# Patient Record
Sex: Female | Born: 1935 | Race: White | Hispanic: No | Smoking: Never smoker
Health system: Southern US, Community
[De-identification: ages and names within clinical notes are randomized; demographics above are authoritative.]

## PROBLEM LIST (undated history)

## (undated) DIAGNOSIS — F0390 Unspecified dementia without behavioral disturbance: Secondary | ICD-10-CM

## (undated) DIAGNOSIS — I10 Essential (primary) hypertension: Secondary | ICD-10-CM

## (undated) HISTORY — PX: KNEE SURGERY: SHX244

## (undated) HISTORY — PX: ABDOMINAL HYSTERECTOMY: SHX81

---

## 1999-12-24 ENCOUNTER — Encounter: Payer: Self-pay | Admitting: Family Medicine

## 1999-12-24 ENCOUNTER — Encounter: Admission: RE | Admit: 1999-12-24 | Discharge: 1999-12-24 | Payer: Self-pay | Admitting: Family Medicine

## 2000-02-17 ENCOUNTER — Encounter: Admission: RE | Admit: 2000-02-17 | Discharge: 2000-05-17 | Payer: Self-pay | Admitting: Family Medicine

## 2002-04-12 ENCOUNTER — Encounter: Payer: Self-pay | Admitting: Family Medicine

## 2002-04-12 ENCOUNTER — Encounter: Admission: RE | Admit: 2002-04-12 | Discharge: 2002-04-12 | Payer: Self-pay | Admitting: Family Medicine

## 2002-05-15 ENCOUNTER — Encounter: Admission: RE | Admit: 2002-05-15 | Discharge: 2002-08-13 | Payer: Self-pay | Admitting: Family Medicine

## 2002-12-26 ENCOUNTER — Ambulatory Visit (HOSPITAL_BASED_OUTPATIENT_CLINIC_OR_DEPARTMENT_OTHER): Admission: RE | Admit: 2002-12-26 | Discharge: 2002-12-26 | Payer: Self-pay | Admitting: Orthopedic Surgery

## 2003-04-30 ENCOUNTER — Encounter: Admission: RE | Admit: 2003-04-30 | Discharge: 2003-04-30 | Payer: Self-pay | Admitting: Family Medicine

## 2003-04-30 ENCOUNTER — Encounter: Payer: Self-pay | Admitting: Family Medicine

## 2003-05-02 ENCOUNTER — Encounter: Admission: RE | Admit: 2003-05-02 | Discharge: 2003-07-31 | Payer: Self-pay | Admitting: Family Medicine

## 2003-05-22 ENCOUNTER — Ambulatory Visit (HOSPITAL_BASED_OUTPATIENT_CLINIC_OR_DEPARTMENT_OTHER): Admission: RE | Admit: 2003-05-22 | Discharge: 2003-05-23 | Payer: Self-pay | Admitting: Orthopedic Surgery

## 2003-12-11 ENCOUNTER — Ambulatory Visit: Admission: RE | Admit: 2003-12-11 | Discharge: 2003-12-11 | Payer: Self-pay | Admitting: Family Medicine

## 2004-05-04 ENCOUNTER — Encounter: Admission: RE | Admit: 2004-05-04 | Discharge: 2004-05-04 | Payer: Self-pay | Admitting: Family Medicine

## 2004-05-18 ENCOUNTER — Inpatient Hospital Stay (HOSPITAL_COMMUNITY): Admission: RE | Admit: 2004-05-18 | Discharge: 2004-05-21 | Payer: Self-pay | Admitting: Orthopedic Surgery

## 2005-05-17 ENCOUNTER — Inpatient Hospital Stay (HOSPITAL_COMMUNITY): Admission: RE | Admit: 2005-05-17 | Discharge: 2005-05-20 | Payer: Self-pay | Admitting: Orthopedic Surgery

## 2005-09-17 ENCOUNTER — Encounter: Admission: RE | Admit: 2005-09-17 | Discharge: 2005-09-17 | Payer: Self-pay | Admitting: Family Medicine

## 2006-09-01 ENCOUNTER — Encounter: Admission: RE | Admit: 2006-09-01 | Discharge: 2006-11-30 | Payer: Self-pay | Admitting: Family Medicine

## 2006-10-11 ENCOUNTER — Encounter: Admission: RE | Admit: 2006-10-11 | Discharge: 2006-10-11 | Payer: Self-pay | Admitting: Family Medicine

## 2006-12-27 ENCOUNTER — Encounter: Admission: RE | Admit: 2006-12-27 | Discharge: 2006-12-27 | Payer: Self-pay | Admitting: Orthopedic Surgery

## 2007-01-11 ENCOUNTER — Encounter: Admission: RE | Admit: 2007-01-11 | Discharge: 2007-01-11 | Payer: Self-pay | Admitting: Orthopedic Surgery

## 2007-02-16 ENCOUNTER — Encounter: Admission: RE | Admit: 2007-02-16 | Discharge: 2007-02-16 | Payer: Self-pay | Admitting: Orthopedic Surgery

## 2007-03-18 ENCOUNTER — Emergency Department (HOSPITAL_COMMUNITY): Admission: EM | Admit: 2007-03-18 | Discharge: 2007-03-18 | Payer: Self-pay | Admitting: Family Medicine

## 2007-06-21 ENCOUNTER — Encounter: Payer: Self-pay | Admitting: Pulmonary Disease

## 2007-09-01 ENCOUNTER — Ambulatory Visit: Payer: Self-pay | Admitting: Gastroenterology

## 2007-09-11 ENCOUNTER — Ambulatory Visit: Payer: Self-pay | Admitting: Gastroenterology

## 2007-12-18 ENCOUNTER — Encounter: Admission: RE | Admit: 2007-12-18 | Discharge: 2007-12-18 | Payer: Self-pay | Admitting: Family Medicine

## 2008-12-13 ENCOUNTER — Encounter: Admission: RE | Admit: 2008-12-13 | Discharge: 2008-12-13 | Payer: Self-pay | Admitting: Family Medicine

## 2009-10-15 DIAGNOSIS — I10 Essential (primary) hypertension: Secondary | ICD-10-CM | POA: Insufficient documentation

## 2009-10-15 DIAGNOSIS — F329 Major depressive disorder, single episode, unspecified: Secondary | ICD-10-CM

## 2009-10-17 ENCOUNTER — Ambulatory Visit: Payer: Self-pay | Admitting: Pulmonary Disease

## 2009-10-17 DIAGNOSIS — E785 Hyperlipidemia, unspecified: Secondary | ICD-10-CM

## 2009-10-17 DIAGNOSIS — G4730 Sleep apnea, unspecified: Secondary | ICD-10-CM | POA: Insufficient documentation

## 2009-12-01 ENCOUNTER — Ambulatory Visit: Payer: Self-pay | Admitting: Pulmonary Disease

## 2009-12-30 ENCOUNTER — Encounter: Payer: Self-pay | Admitting: Pulmonary Disease

## 2009-12-31 ENCOUNTER — Ambulatory Visit: Payer: Self-pay | Admitting: Pulmonary Disease

## 2010-01-12 ENCOUNTER — Encounter: Payer: Self-pay | Admitting: Pulmonary Disease

## 2010-01-14 ENCOUNTER — Encounter: Payer: Self-pay | Admitting: Pulmonary Disease

## 2010-02-04 ENCOUNTER — Encounter: Payer: Self-pay | Admitting: Pulmonary Disease

## 2010-10-21 ENCOUNTER — Ambulatory Visit: Payer: Self-pay | Admitting: Cardiology

## 2010-10-21 ENCOUNTER — Inpatient Hospital Stay (HOSPITAL_COMMUNITY): Admission: AD | Admit: 2010-10-21 | Discharge: 2010-10-24 | Payer: Self-pay | Admitting: Internal Medicine

## 2010-10-22 ENCOUNTER — Encounter (INDEPENDENT_AMBULATORY_CARE_PROVIDER_SITE_OTHER): Payer: Self-pay | Admitting: Internal Medicine

## 2010-10-23 ENCOUNTER — Encounter (INDEPENDENT_AMBULATORY_CARE_PROVIDER_SITE_OTHER): Payer: Self-pay | Admitting: Internal Medicine

## 2010-10-30 ENCOUNTER — Encounter: Payer: Self-pay | Admitting: Pulmonary Disease

## 2010-11-09 ENCOUNTER — Ambulatory Visit: Payer: Self-pay | Admitting: Pulmonary Disease

## 2010-11-09 DIAGNOSIS — J90 Pleural effusion, not elsewhere classified: Secondary | ICD-10-CM | POA: Insufficient documentation

## 2010-11-09 DIAGNOSIS — R638 Other symptoms and signs concerning food and fluid intake: Secondary | ICD-10-CM | POA: Insufficient documentation

## 2010-11-11 ENCOUNTER — Telehealth: Payer: Self-pay | Admitting: Pulmonary Disease

## 2010-11-17 ENCOUNTER — Ambulatory Visit: Payer: Self-pay | Admitting: Internal Medicine

## 2010-11-17 ENCOUNTER — Telehealth: Payer: Self-pay | Admitting: Pulmonary Disease

## 2010-11-25 ENCOUNTER — Ambulatory Visit
Admission: RE | Admit: 2010-11-25 | Discharge: 2010-11-25 | Payer: Self-pay | Source: Home / Self Care | Attending: Pulmonary Disease | Admitting: Pulmonary Disease

## 2010-12-13 ENCOUNTER — Encounter: Payer: Self-pay | Admitting: Family Medicine

## 2010-12-18 ENCOUNTER — Other Ambulatory Visit (HOSPITAL_COMMUNITY): Payer: Self-pay | Admitting: Internal Medicine

## 2010-12-18 DIAGNOSIS — Z0000 Encounter for general adult medical examination without abnormal findings: Secondary | ICD-10-CM

## 2010-12-22 NOTE — Assessment & Plan Note (Signed)
Summary: 1 month/ mbw   Copy to:  pcp Primary Provider/Referring Provider:  Dr. Felipa Eth  CC:  1 month followup.  Pt states that her cpap mask is uncomfortable and she has difficulty with keeping it on.  She states that she has not used cpap in over 1 wk.  .  History of Present Illness: 77/F referred for post CPAP FU of obstructive sleep apnea. She underwent PSG in 04/21/2007 at a sleep lab on [facility] & required CPAP intervention. She never used her CPAP consistently , ' it would always be off my face in the mornings' & then stopped altogether. She now reports excessive daytime fatigue . Epworth Sleepiness Score is 5/24 but I suspect under-reporting. Sleep latency is minimal, her husband of 53 years passed away in 04-21-07, & she has lost 20 lbs since. Started on Pristiq foe depression.  She haas 2-3 awakenings for nocturia, daughter describes loud snoring which has improved with wt loss but no witness for apneas. Sleep is non refreshing, no morning headaches. Other causes of fatigue include depressions, meds, occult hypothyroidism, uncontrolled DM 12/1 > reviewed PSG 06/21/07, wt 231 lbs, RDI 35/h, nadir desatn 86%, correctd by CPAP 9 cm, nasal mask., rare PLMs.  December 01, 2009 3:19 PM  Used CPAP x 3 hrs/ night then would wake up with CPAP off her face. Could not put it back on due to many straps & lights off. C/o memory deficits. Pressure Ok, mask difficult to use, no dryness. Trial of nasal pillows - this may make it simpler for her to use & put back on if it comes off at night.  December 31, 2009 3:58 PM  Got new swift pillows Reviewed download 1/12-2/8 >> poor compliance, leak ++, AHI ok Appears frustrated & confused as to reasons for non compliance - unable to pinpoint  a particular issue. At the same time ' worried about having a heart attack inmy sleep'    Current Medications (verified): 1)  Januvia 100 Mg Tabs (Sitagliptin Phosphate) .Marland Kitchen.. 1 Once Daily 2)  Avandamet 02-999 Mg Tabs  (Rosiglitazone-Metformin) .Marland Kitchen.. 1 Two Times A Day 3)  Glipizide 5 Mg Tabs (Glipizide) .Marland Kitchen.. 1 Once Daily 4)  Triamterene-Hctz 37.5-25 Mg Tabs (Triamterene-Hctz) .Marland Kitchen.. 1 Once Daily 5)  Benicar 40 Mg Tabs (Olmesartan Medoxomil) .... 1/2 Once Daily 6)  Aspirin 81 Mg Tbec (Aspirin) .Marland Kitchen.. 1 Once Daily 7)  Miralax  Pack (Polyethylene Glycol 3350) .... As Directed As Needed 8)  Meclizine Hcl 12.5 Mg Tabs (Meclizine Hcl) .Marland Kitchen.. 1-2 Tabs 3-4 Times Per Day As Needed  Allergies (verified): No Known Drug Allergies  Past History:  Past Medical History: Last updated: 10/17/2009 HYPERLIPIDEMIA (ICD-272.4) DEPRESSION (ICD-311) HYPERTENSION (ICD-401.9)    Social History: Last updated: 10/17/2009 Marital Status: widowed Children: yes Occupation: retired Patient never smoked.   Review of Systems  The patient denies anorexia, fever, weight loss, weight gain, vision loss, decreased hearing, hoarseness, chest pain, syncope, dyspnea on exertion, peripheral edema, prolonged cough, headaches, hemoptysis, abdominal pain, melena, hematochezia, severe indigestion/heartburn, hematuria, muscle weakness, difficulty walking, depression, unusual weight change, and abnormal bleeding.    Vital Signs:  Patient profile:   77 year old female Weight:      208 pounds O2 Sat:      99 % on Room air Temp:     97.9 degrees F oral Pulse rate:   82 / minute BP sitting:   138 / 76  (left arm)  Vitals Entered By: Vernie Murders (December 31, 2009 3:53 PM)  O2 Flow:  Room air  Physical Exam  Additional Exam:  Gen. Pleasant, well-nourished, in no distress, normal affect ENT - no lesions, no post nasal drip, class 2 airway Neck: No JVD, no thyromegaly, no carotid bruits Lungs: no use of accessory muscles, no dullness to percussion, clear without rales or rhonchi  Cardiovascular: Rhythm regular, heart sounds  normal, no murmurs or gallops, no peripheral edema Musculoskeletal: No deformities, no cyanosis or clubbing       Impression & Recommendations:  Problem # 1:  OBSTRUCTIVE SLEEP APNEA (ICD-780.57)  Compliance encouraged, wt loss emphasized, asked to avoid meds with sedative side effects, cautioned against driving when sleepy.  She will go over to the sleeplab for mask fit & desensitization. Leak will also be investigated. If compliance remians poor, it may be time to abandon cpap approach & investigae other causes of fatigue- hypothyroidism, depression etc.  Orders: Est. Patient Level III (21308) DME Referral (DME)  Medications Added to Medication List This Visit: 1)  Meclizine Hcl 12.5 Mg Tabs (Meclizine hcl) .Marland Kitchen.. 1-2 tabs 3-4 times per day as needed  Patient Instructions: 1)  Copy sent to: Dr Felipa Eth 2)  Call Sleep Lab [phone] & schedule appointment for mask/ machine check. 3)  We will check another download in 1 month. 4)  If no better, we will ask Dr Felipa Eth to look at other causes of fatigue.

## 2010-12-22 NOTE — Letter (Signed)
Summary: [facility]  [facility]   Imported By: Sherian Rein 01/21/2010 11:45:52  _____________________________________________________________________  External Attachment:    Type:   Image     Comment:   External Document

## 2010-12-22 NOTE — Assessment & Plan Note (Signed)
Summary: rov ///kp   Visit Type:  Follow-up Copy to:  pcp Primary Provider/Referring Provider:  Dr. Felipa Eth  CC:  Pt here for follow up.  History of Present Illness: 77/F referred for post CPAP FU of obstructive sleep apnea. She underwent PSG in 04-27-2007 at a sleep lab on [facility] & required CPAP intervention. She never used her CPAP consistently , ' it would always be off my face in the mornings' & then stopped altogether. She now reports excessive daytime fatigue . Epworth Sleepiness Score is 5/24 but I suspect under-reporting. Sleep latency is minimal, her husband of 53 years passed away in 2007/04/27, & she has lost 20 lbs since. Started on Pristiq foe depression.  She haas 2-3 awakenings for nocturia, daughter describes loud snoring which has improved with wt loss but no witness for apneas. Sleep is non refreshing, no morning headaches. Other causes of fatigue include depressions, meds, occult hypothyroidism, uncontrolled DM 12/1 > reviewed PSG 06/21/07, wt 231 lbs, RDI 35/h, nadir desatn 86%, correctd by CPAP 9 cm, nasal mask., rare PLMs.  December 01, 2009 3:19 PM  Used CPAP x 3 hrs/ night then would wake up with CPAP off her face. Could not put it back on due to many straps & lights off. C/o memory deficits. Pressure Ok, mask difficult to use, no dryness.  Current Medications (verified): 1)  Januvia 100 Mg Tabs (Sitagliptin Phosphate) .Marland Kitchen.. 1 Once Daily 2)  Avandamet 02-999 Mg Tabs (Rosiglitazone-Metformin) .Marland Kitchen.. 1 Two Times A Day 3)  Glipizide 5 Mg Tabs (Glipizide) .Marland Kitchen.. 1 Once Daily 4)  Triamterene-Hctz 37.5-25 Mg Tabs (Triamterene-Hctz) .Marland Kitchen.. 1 Once Daily 5)  Benicar 40 Mg Tabs (Olmesartan Medoxomil) .... 1/2 Once Daily 6)  Aspirin 81 Mg Tbec (Aspirin) .Marland Kitchen.. 1 Once Daily 7)  Miralax  Powd (Polyethylene Glycol 3350) .... As Needed 8)  Miralax  Pack (Polyethylene Glycol 3350) .... As Directed As Needed  Allergies (verified): No Known Drug Allergies  Past History:  Past Medical History: Last  updated: 10/17/2009 HYPERLIPIDEMIA (ICD-272.4) DEPRESSION (ICD-311) HYPERTENSION (ICD-401.9)    Social History: Last updated: 10/17/2009 Marital Status: widowed Children: yes Occupation: retired Patient never smoked.   Review of Systems  The patient denies anorexia, fever, weight loss, weight gain, vision loss, decreased hearing, hoarseness, chest pain, syncope, dyspnea on exertion, peripheral edema, prolonged cough, headaches, hemoptysis, abdominal pain, melena, hematochezia, severe indigestion/heartburn, hematuria, muscle weakness, difficulty walking, depression, unusual weight change, and abnormal bleeding.    Vital Signs:  Patient profile:   77 year old female Height:      65 inches Weight:      213 pounds O2 Sat:      97 % on Room air Temp:     97.9 degrees F oral Pulse rate:   74 / minute BP sitting:   138 / 70  (left arm) Cuff size:   large  Vitals Entered By: Zackery Barefoot CMA (December 01, 2009 2:57 PM)  O2 Flow:  Room air CC: Pt here for follow up Comments Medications reviewed with patient Zackery Barefoot CMA  December 01, 2009 2:57 PM    Physical Exam  Additional Exam:  Gen. Pleasant, well-nourished, in no distress, normal affect ENT - no lesions, no post nasal drip, class 2 airway Neck: No JVD, no thyromegaly, no carotid bruits Lungs: no use of accessory muscles, no dullness to percussion, clear without rales or rhonchi  Cardiovascular: Rhythm regular, heart sounds  normal, no murmurs or gallops, no peripheral edema Musculoskeletal: No deformities, no  cyanosis or clubbing      Impression & Recommendations:  Problem # 1:  OBSTRUCTIVE SLEEP APNEA (ICD-780.57) Compliance encouraged, wt loss emphasized, asked to avoid meds with sedative side effects, cautioned against driving when sleepy.  Trial of nasal pillows - this may make it simpler for her to use & put back on if it comes off at night. Review download for objective data. If remains sleepy,  consider other causes Orders: Est. Patient Level III (22025) DME Referral (DME)  Medications Added to Medication List This Visit: 1)  Miralax Pack (Polyethylene glycol 3350) .... As directed as needed  Patient Instructions: 1)  Please schedule a follow-up appointment in 1 month. 2)  Trial of nasal pillows & download

## 2010-12-24 ENCOUNTER — Ambulatory Visit (HOSPITAL_COMMUNITY)
Admission: RE | Admit: 2010-12-24 | Discharge: 2010-12-24 | Disposition: A | Discharge status: Home / Self Care | Payer: Medicare Other | Source: Ambulatory Visit | Attending: Internal Medicine | Admitting: Internal Medicine

## 2010-12-24 DIAGNOSIS — Z0000 Encounter for general adult medical examination without abnormal findings: Secondary | ICD-10-CM

## 2010-12-24 DIAGNOSIS — Z1231 Encounter for screening mammogram for malignant neoplasm of breast: Secondary | ICD-10-CM | POA: Insufficient documentation

## 2010-12-24 NOTE — Assessment & Plan Note (Signed)
Summary: rov 2-3 wks w/ cxr- ok to double per RA//jwr   Visit Type:  Follow-up Copy to:  pcp Primary Provider/Referring Provider:  Dr. Felipa Eth  CC:  Pt states breathing has improved. c/o constipation x 2 to 3 weeks.  History of Present Illness: 77/F for FU of obstructive sleep apnea & rt pleural effusion.  Initial consult for fatigue attributed to sleep apnea. Other possible causes of fatigue include depression, meds, occult hypothyroidism, uncontrolled DM 12/1 > reviewed PSG 06/21/07, wt 231 lbs, RDI 35/h, nadir desatn 86%, corrected by CPAP 9 cm, nasal mask., rare PLMs. Could not tolerate CPAP   November 09, 2010  Adm 11/30 -10/24/10 for rt effusion, thoracentesis - transudative fluid wih prot < 3, LDH 92, 500 WCs , 59 % lymphs, CT chest showed large rt & small left effusion, echo - RVSP 49, BNP 297 12/9 serum alb 3.1, Urine prot neg She has lost 40 lbs over the last yr, 64 lbs since 2008 ! No pedal edema, orthopnea, PND - feels better with lasix, good urine output  November 25, 2010 1:55 PM  Dyspnea improved, no cough, lost another 6 lbs, CT abd neg, c/o constipation CXR  - decreased effusion    Preventive Screening-Counseling & Management  Alcohol-Tobacco     Smoking Status: never  Current Medications (verified): 1)  Januvia 100 Mg Tabs (Sitagliptin Phosphate) .Marland Kitchen.. 1 Once Daily 2)  Metformin Hcl 1000 Mg Tabs (Metformin Hcl) .... Take 1 Tablet By Mouth Two Times A Day 3)  Miralax  Pack (Polyethylene Glycol 3350) .... As Directed As Needed 4)  Simvastatin 40 Mg Tabs (Simvastatin) .... Take 1 Tablet By Mouth Once A Day 5)  Aricept 10 Mg Tabs (Donepezil Hcl) .... Take 1 Tablet By Mouth Once A Day 6)  Aspirin 81 Mg  Tabs (Aspirin) .... Take 1 Tablet By Mouth Once A Day 7)  Losartan Potassium 50 Mg Tabs (Losartan Potassium) .... Take 1/2 Tablet By Mouth Two Times A Day 8)  Vitamin D (Ergocalciferol) 50000 Unit Caps (Ergocalciferol) .... Twice Weekly 9)  Citalopram Hydrobromide 20  Mg Tabs (Citalopram Hydrobromide) .... Take 1 Tablet By Mouth Once A Day 10)  Furosemide 40 Mg Tabs (Furosemide) .... Take 1 Tablet By Mouth Every Morning 11)  Klor-Con M20 20 Meq Cr-Tabs (Potassium Chloride Crys Cr) .... Take 1 Tablet By Mouth Every Morning  Allergies (verified): No Known Drug Allergies  Past History:  Past Medical History: Last updated: 10/17/2009 HYPERLIPIDEMIA (ICD-272.4) DEPRESSION (ICD-311) HYPERTENSION (ICD-401.9)    Social History: Last updated: 10/17/2009 Marital Status: widowed Children: yes Occupation: retired Patient never smoked.   Review of Systems  The patient denies anorexia, fever, weight loss, weight gain, vision loss, decreased hearing, hoarseness, chest pain, syncope, dyspnea on exertion, peripheral edema, prolonged cough, headaches, hemoptysis, abdominal pain, melena, hematochezia, severe indigestion/heartburn, hematuria, muscle weakness, suspicious skin lesions, transient blindness, difficulty walking, depression, unusual weight change, abnormal bleeding, enlarged lymph nodes, and angioedema.    Vital Signs:  Patient profile:   77 year old female Height:      65 inches Weight:      160 pounds BMI:     26.72 O2 Sat:      98 % on Room air Temp:     98.4 degrees F oral Pulse rate:   66 / minute BP sitting:   116 / 58  (right arm) Cuff size:   regular  Vitals Entered By: Zackery Barefoot CMA (November 25, 2010 1:42 PM)  O2  Flow:  Room air CC: Pt states breathing has improved. c/o constipation x 2 to 3 weeks Comments Medications reviewed with patient Verified contact number and pharmacy with patient Zackery Barefoot CMA  November 25, 2010 1:42 PM    Physical Exam  Additional Exam:  Gen. Pleasant, well-nourished, in no distress, normal affect ENT - no lesions, no post nasal drip, class 2 airway Neck: No JVD, no thyromegaly, no carotid bruits Lungs: no use of accessory muscles, no dullness to percussion, decreased rt base, no rhonchi    Cardiovascular: Rhythm regular, heart sounds  normal, no murmurs or gallops, no peripheral edema Musculoskeletal: No deformities, no cyanosis or clubbing      CXR  Procedure date:  11/25/2010  Findings:      Comparison: 11/09/2010   Findings: Pleural effusion on the right is smaller.  There is some persistent volume loss at the right base related to the fusion. The upper lung is clear.  On the left, there is no pleural fluid. The left lung is clear.  Ordinary degenerative changes effect the spine.   IMPRESSION: Reduction in amount of pleural fluid on the right.  Some persistent fluid at the base with adjacent atelectatic change in the right lower lung.  CT Abdomen/Pelvis  Procedure date:  11/17/2010  Findings:      IMPRESSION: Moderate right pleural effusion.   No findings to explain the patient's history of unintentional weight loss.  Impression & Recommendations:  Problem # 1:  PLEURAL EFFUSION (ICD-511.9) Assessment Improved Almost resolved with diuresis Decrease lasix to 20mg  (1/2 tab) x 2 weeks then stop & observe for recurrence Orders: Est. Patient Level III (36644) T-2 View CXR (71020TC)  Problem # 2:  WEIGHT LOSS-ABNORMAL (ICD-783.2) Unclear cause Ct abd neg, previous wu by dr Felipa Eth noted ? depression Age appropriate cancer screening- defer to dr Felipa Eth  Patient Instructions: 1)  Copy sent to: Dr Felipa Eth 2)  Please schedule a follow-up appointment in 4 months. 3)  The fluid has resolved.  4)  Decrease lasix to 1/2 pill once daily

## 2010-12-24 NOTE — Letter (Signed)
Summary: [facility]  [facility]   Imported By: Lester [place] 11/07/2010 11:03:04  _____________________________________________________________________  External Attachment:    Type:   Image     Comment:   External Document

## 2010-12-24 NOTE — Progress Notes (Signed)
Summary: rov  Phone Note Call from Patient   Caller: Daughter Gwenn Call For: alva Summary of Call: pt's daughter was told to call re: an appt for pt that was resc'd. 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 call and apeak to gwenn at [phone] Initial call taken by: Tivis Ringer, CNA,  November 11, 2010 4:21 PM  Follow-up for Phone Call        Clarified with [facility] date and time of appointment. Zackery Barefoot CMA  November 11, 2010 4:38 PM

## 2010-12-24 NOTE — Assessment & Plan Note (Signed)
Summary: ROV W/ CXR ///KP   Visit Type:  Follow-up Copy to:  pcp Primary Provider/Referring Provider:  Dr. Felipa Eth  CC:  Pt c/o SOB and intermittent dry cough.  History of Present Illness: 78/F for FU of obstructive sleep apnea & rt pleural effusion.  Initial consult for fatigue attributed to sleep apnea. Other possible causes of fatigue include depression, meds, occult hypothyroidism, uncontrolled DM 12/1 > reviewed PSG 06/21/07, wt 231 lbs, RDI 35/h, nadir desatn 86%, corrected by CPAP 9 cm, nasal mask., rare PLMs. Could not tolerate CPAP   November 09, 2010  Adm 11/30 -10/24/10 for rt effusion, thoracentesis - transudative fluid wih prot < 3, LDH 92, 500 WCs , 59 % lymphs, CT chest showed large rt & small left effusion, echo - RVSP 49, BNP 297 12/9 serum alb 3.1, Urine prot neg She has lost 40 lbs over the last yr, 64 lbs since 2008 ! No pedal edema, orthopnea, PND - feels better with lasix, good urine output    Preventive Screening-Counseling & Management  Alcohol-Tobacco     Smoking Status: never  Current Medications (verified): 1)  Januvia 100 Mg Tabs (Sitagliptin Phosphate) .Marland Kitchen.. 1 Once Daily 2)  Metformin Hcl 1000 Mg Tabs (Metformin Hcl) .... Take 1 Tablet By Mouth Two Times A Day 3)  Miralax  Pack (Polyethylene Glycol 3350) .... As Directed As Needed 4)  Simvastatin 40 Mg Tabs (Simvastatin) .... Take 1 Tablet By Mouth Once A Day 5)  Aricept 10 Mg Tabs (Donepezil Hcl) .... Take 1 Tablet By Mouth Once A Day 6)  Aspirin 81 Mg  Tabs (Aspirin) .... Take 1 Tablet By Mouth Once A Day 7)  Losartan Potassium 50 Mg Tabs (Losartan Potassium) .... Take 1/2 Tablet By Mouth Two Times A Day 8)  Vitamin D (Ergocalciferol) 50000 Unit Caps (Ergocalciferol) .... Twice Weekly 9)  Citalopram Hydrobromide 20 Mg Tabs (Citalopram Hydrobromide) .... Take 1 Tablet By Mouth Once A Day 10)  Furosemide 40 Mg Tabs (Furosemide) .... Take 1 Tablet By Mouth Every Morning 11)  Klor-Con M20 20 Meq Cr-Tabs  (Potassium Chloride Crys Cr) .... Take 1 Tablet By Mouth Every Morning  Allergies (verified): No Known Drug Allergies  Past History:  Past Medical History: Last updated: 10/17/2009 HYPERLIPIDEMIA (ICD-272.4) DEPRESSION (ICD-311) HYPERTENSION (ICD-401.9)    Social History: Last updated: 10/17/2009 Marital Status: widowed Children: yes Occupation: retired Patient never smoked.   Review of Systems       The patient complains of weight loss.  The patient denies anorexia, fever, weight gain, vision loss, decreased hearing, hoarseness, chest pain, syncope, dyspnea on exertion, peripheral edema, prolonged cough, headaches, hemoptysis, abdominal pain, melena, hematochezia, severe indigestion/heartburn, hematuria, muscle weakness, suspicious skin lesions, difficulty walking, depression, unusual weight change, abnormal bleeding, enlarged lymph nodes, and angioedema.    Vital Signs:  Patient profile:   77 year old female Height:      65 inches Weight:      166 pounds BMI:     27.72 O2 Sat:      96 % on Room air Temp:     98.1 degrees F oral Pulse rate:   67 / minute BP sitting:   118 / 62  (left arm) Cuff size:   regular  Vitals Entered By: Zackery Barefoot CMA (November 09, 2010 2:28 PM)  O2 Flow:  Room air CC: Pt c/o SOB, intermittent dry cough Comments Medications reviewed with patient Verified contact number and pharmacy with patient Zackery Barefoot [facility]  November 09, 2010  2:37 PM    Physical Exam  Additional Exam:  Gen. Pleasant, well-nourished, in no distress, normal affect ENT - no lesions, no post nasal drip, class 2 airway Neck: No JVD, no thyromegaly, no carotid bruits Lungs: no use of accessory muscles, no dullness to percussion, decreased rt base, no rhonchi  Cardiovascular: Rhythm regular, heart sounds  normal, no murmurs or gallops, no peripheral edema Musculoskeletal: No deformities, no cyanosis or clubbing      CXR  Procedure date:   11/09/2010  Findings:      Comparison: Chest x-ray of 10/23/2010   Findings: There has been slight increase in volume of the right pleural effusion with right basilar atelectasis.  The left effusion is no longer seen.  No focal infiltrate is noted.  The heart is within normal limits in size.  No bony abnormality is seen.   IMPRESSION: Slight increase in volume of right pleural effusion with mild right basilar atelectasis.  Impression & Recommendations:  Problem # 1:  PLEURAL EFFUSION (ICD-511.9) Unclear cause - no hepatic or renal pathology obvious Only discernible cause is pulm hypertension - stay on lasix , no diastolic dysfunction noted on echo but doubt this is primary at her age. ct lasix x 2 more weeks & see if effusion decreases by rpt CXR Orders: Est. Patient Level IV (16967) Radiology Referral (Radiology) T-2 View CXR (71020TC)  Problem # 2:  WEIGHT LOSS-ABNORMAL (ICD-783.2) Occult malignancy remains a concern & age appropriate cancer screening would be recommended CT abdomen/ pelvis  Orders: Est. Patient Level IV (89381) Radiology Referral (Radiology)  Medications Added to Medication List This Visit: 1)  Metformin Hcl 1000 Mg Tabs (Metformin hcl) .... Take 1 tablet by mouth two times a day 2)  Simvastatin 40 Mg Tabs (Simvastatin) .... Take 1 tablet by mouth once a day 3)  Aricept 10 Mg Tabs (Donepezil hcl) .... Take 1 tablet by mouth once a day 4)  Aspirin 81 Mg Tabs (Aspirin) .... Take 1 tablet by mouth once a day 5)  Losartan Potassium 50 Mg Tabs (Losartan potassium) .... Take 1/2 tablet by mouth once a day 6)  Losartan Potassium 50 Mg Tabs (Losartan potassium) .... Take 1/2 tablet by mouth two times a day 7)  Vitamin D (ergocalciferol) 50000 Unit Caps (Ergocalciferol) .... Twice weekly 8)  Citalopram Hydrobromide 20 Mg Tabs (Citalopram hydrobromide) .... Take 1 tablet by mouth once a day 9)  Furosemide 40 Mg Tabs (Furosemide) .... Take 1 tablet by mouth every  morning 10)  Klor-con M20 20 Meq Cr-tabs (Potassium chloride crys cr) .... Take 1 tablet by mouth every morning  Patient Instructions: 1)  Copy sent to:dr Avva 2)  Please schedule a follow-up appointment in 2- 3 weeks with chest x ray 3)  Stay on water pills once daily  4)  CT abdomen with contrast

## 2010-12-24 NOTE — Progress Notes (Signed)
Summary: ? metformin -restart on 11-18-10  Phone Note Call from Patient Call back at [phone]   Caller: patient's daughter, Dedra Skeens Call For: Vassie Loll Summary of Call: Daughter calling stating pt had stopped her Metformin (per CT instructions) and she had CT done today that was ordered by RA.   Daughter wanted to know when pt can restart her Metformin.  Please advise.  Thanks.   Initial call taken by: Arman Filter LPN,  November 17, 2010 12:01 PM  Follow-up for Phone Call        ok to resume tomorrow. Also let her know - no findings on CT to explain her wt loss Follow-up by: Comer Locket. Vassie Loll MD,  November 17, 2010 3:03 PM  Additional Follow-up for Phone Call Additional follow up Details #1::        [facility] Gweneth Dimitri RN  November 17, 2010 3:21 PM     Additional Follow-up for Phone Call Additional follow up Details #2::    pt daughter aware of recs and CT results.Carron Curie CMA  November 17, 2010 3:31 PM

## 2011-02-02 LAB — COMPREHENSIVE METABOLIC PANEL
ALT: 16 U/L (ref 0–35)
ALT: 16 U/L (ref 0–35)
AST: 26 U/L (ref 0–37)
AST: 26 U/L (ref 0–37)
Albumin: 3.6 g/dL (ref 3.5–5.2)
CO2: 28 mEq/L (ref 19–32)
Calcium: 8.8 mg/dL (ref 8.4–10.5)
Chloride: 104 mEq/L (ref 96–112)
Creatinine, Ser: 1.01 mg/dL (ref 0.4–1.2)
GFR calc Af Amer: >60 mL/min (ref >60)
GFR calc non Af Amer: 56 mL/min — ABNORMAL LOW (ref >60)
Glucose, Bld: 86 mg/dL (ref 70–99)
Sodium: 141 mEq/L (ref 135–145)
Sodium: 143 mEq/L (ref 135–145)
Total Bilirubin: 0.5 mg/dL (ref 0.3–1.2)
Total Protein: 6.2 g/dL (ref 6.0–8.3)

## 2011-02-02 LAB — CBC
HCT: 27.7 % — ABNORMAL LOW (ref 36.0–46.0)
HCT: 31.4 % — ABNORMAL LOW (ref 36.0–46.0)
Hemoglobin: 10 g/dL — ABNORMAL LOW (ref 12.0–15.0)
Hemoglobin: 9 g/dL — ABNORMAL LOW (ref 12.0–15.0)
MCH: 30.8 pg (ref 26.0–34.0)
MCH: 31.4 pg (ref 26.0–34.0)
MCHC: 31.8 g/dL (ref 30.0–36.0)
MCHC: 32.5 g/dL (ref 30.0–36.0)
RBC: 2.87 MIL/uL — ABNORMAL LOW (ref 3.87–5.11)
RDW: 15.2 % (ref 11.5–15.5)

## 2011-02-02 LAB — URINALYSIS, ROUTINE W REFLEX MICROSCOPIC
Glucose, UA: NEGATIVE mg/dL
Ketones, ur: NEGATIVE mg/dL
Nitrite: NEGATIVE
Protein, ur: NEGATIVE mg/dL
Specific Gravity, Urine: 1.012 (ref 1.005–1.030)

## 2011-02-02 LAB — GLUCOSE, CAPILLARY
Glucose-Capillary: 106 mg/dL — ABNORMAL HIGH (ref 70–99)
Glucose-Capillary: 80 mg/dL (ref 70–99)
Glucose-Capillary: 89 mg/dL (ref 70–99)
Glucose-Capillary: 95 mg/dL (ref 70–99)
Glucose-Capillary: 97 mg/dL (ref 70–99)

## 2011-02-02 LAB — PROTEIN, BODY FLUID: Total protein, fluid: <3 g/dL

## 2011-02-02 LAB — DIFFERENTIAL
Basophils Absolute: 0 10*3/uL (ref 0.0–0.1)
Basophils Relative: 0 % (ref 0–1)
Eosinophils Absolute: 0.1 10*3/uL (ref 0.0–0.7)
Monocytes Absolute: 0.5 10*3/uL (ref 0.1–1.0)
Monocytes Relative: 9 % (ref 3–12)

## 2011-02-02 LAB — BODY FLUID CELL COUNT WITH DIFFERENTIAL

## 2011-02-02 LAB — BODY FLUID CULTURE: Culture: NO GROWTH

## 2011-02-02 LAB — URINE MICROSCOPIC-ADD ON

## 2011-02-02 LAB — PATHOLOGIST SMEAR REVIEW

## 2011-02-02 LAB — LACTATE DEHYDROGENASE, PLEURAL OR PERITONEAL FLUID

## 2011-02-02 LAB — AMYLASE, BODY FLUID: Amylase, Fluid: 38 U/L

## 2011-02-03 LAB — BASIC METABOLIC PANEL
BUN: 18 mg/dL (ref 6–23)
CO2: 27 mEq/L (ref 19–32)
Calcium: 8.6 mg/dL (ref 8.4–10.5)
Chloride: 103 mEq/L (ref 96–112)
Creatinine, Ser: 1.12 mg/dL (ref 0.4–1.2)
Glucose, Bld: 101 mg/dL — ABNORMAL HIGH (ref 70–99)

## 2011-04-09 NOTE — Op Note (Signed)
NAME:  Mackenzie Harmon, Mackenzie Harmon                           ACCOUNT NO.:  [id number]   MEDICAL RECORD NO.:  [id number]                   PATIENT TYPE:  INP   LOCATION:  6715                                 FACILITY:  [facility]   PHYSICIAN:  Elana Alm. Thurston Hole, M.D.              DATE OF BIRTH:  24-Mar-1932   DATE OF PROCEDURE:  05/18/2004  DATE OF DISCHARGE:                                 OPERATIVE REPORT   PREOPERATIVE DIAGNOSIS:  Left knee degenerative joint disease.   POSTOPERATIVE DIAGNOSIS:  Left knee degenerative joint disease.   OPERATION PERFORMED:  1. Left total knee replacement using [employer] Scorpio total knee system     with a #9 cemented femur, #9 cemented tibia with 12 mm polyethylene     flexed tibial spacer with 26 mm polyethylene cemented patella.  2. Left knee lateral retinacular release.   SURGEON:  Elana Alm. Thurston Hole, M.D.   ASSISTANT:  Julien Girt, P.A.   ANESTHESIA:  General.   OPERATIVE TIME:  One hour and 20 minutes.   COMPLICATIONS:  None.   DESCRIPTION OF PROCEDURE:  Mackenzie Harmon was brought to the operating room on  May 18, 2004 and placed on the operating table in supine position.  She  received Ancef 1 g IV preoperatively for prophylaxis.  After being placed  under general anesthesia, she had a Foley catheter placed under sterile  conditions.  She had her left knee examined under anesthesia.  Range of  motion -8 to 115 degrees with mild varus deformity.  Knee stable to  ligamentous exam.  Left leg was prepped using sterile DuraPrep and draped  using sterile technique.  The leg was exsanguinated and a thigh tourniquet  elevated to 375 mmHg.  Initially, through a 15 cm longitudinal incision  based over the patella, initial exposure was made.  The underlying  subcutaneous tissues were incised in line with the skin incision.  A median  arthrotomy was performed revealing an excessive amount of normal-appearing  joint fluid.  The articular surfaces were inspected.   She had grade 4  changed medially, grade 3 changes laterally and grade 3 and 4 changes in the  patellofemoral joint.  Large osteophytes on the patellar and femoral  condyles and tibial plateau were removed.  Medial and lateral meniscal  remnants were removed as well as anterior cruciate ligament.  An  intramedullary drill was then drilled up the femoral canal for placement of  the distal femoral cutting jig which was placed in the appropriate amount of  rotation and the distal 12 mm cut was made.  The distal femur was then  sized.  A #9 was found to be the appropriate size and a #9 cutting jig was  placed and then these cuts were made.  After this was done, the proximal  tibia was exposed.  The tibial spines were removed with an oscillating saw.  An intramedullary drill was  drilled down the tibial canal for placement of  the proximal tibial cutting jig which was placed in the appropriate amount  of rotation and a proximal 6 mm cut was made.  After this was done, the  Scorpio PCL cutter was placed back on the distal femur and these cuts were  made.  At this point the #9 femoral trial was placed, a #9 tibial base plate  trial was placed and with a 12 mm polyethylene tibial spacer there was found  to be excellent restoration of normal alignment, excellent stability, range  of motion 0 to 120 degrees.  The tibial base plate was then marked for  rotation and the keel cut was made.  After this was done, the patella was  sized.  A 26 mm was found to be the appropriate size and a recessed 10 mm x  26 mm cut was made and three locking  holes were placed.  At this point it  was felt that all of the trial components were of excellent size, fit and  stability.  They were then removed and the knee was jet lavage irrigated  with three liters of saline solution.  The proximal tibia was then exposed  and then a #9 tibial base plate with cement vacuum was hammered into  position with an excellent fit with  excess cement being removed from around  the edges.  The #9 femoral component with cement backing was hammered into  position, also with an excellent fit with excess cement being removed from  around the edges.  A 12 mm polyethylene Flex tibial spacer was then locked  on the tibial base plate.  The knee was taken through a range of motion, 0  to 120 degrees with excellent stability and no lift off on the tray.  The 26  mm polyethylene cement backed patella was then locked into its recessed  hole and held there with a clamp.  After the cement had hardened,  patellofemoral tracking was evaluated.  There was still found to be  excessive lateral tightness and lateral tracking and a lateral retinacular  release was carried out improving patellar tracking to normal.  At this  point it was felt that all the components were of excellent size, fit and  stability.  The knee and wound were further irrigated with saline and then  the arthrotomy was closed with #1 Ethibond sutures over two medium Hemovac  drains.  Subcutaneous tissues were closed with 0 and 2-0 Vicryl.  Skin  closed with skin staples.  Sterile dressings were applied.  Hemovac injected  with 0.25% Marcaine with epinephrine and clamped.  Tourniquet was released.  The patient then had a femoral nerve block placed by anesthesia for  postoperative pain control.  She was then awakened, extubated and taken to  recovery room in stable condition.  Sponge and needle counts were correct  times two at the end of this case.                                               Robert A. Thurston Hole, M.D.    RAW/MEDQ  D:  05/18/2004  T:  05/18/2004  Job:  [id number]

## 2011-04-09 NOTE — Op Note (Signed)
Mackenzie Harmon, Mackenzie Harmon                 ACCOUNT NO.:  [id number]   MEDICAL RECORD NO.:  [id number]          PATIENT TYPE:  INP   LOCATION:  2550                         FACILITY:  [facility]   PHYSICIAN:  Elana Alm. Thurston Hole, M.D. DATE OF BIRTH:  06-04-1932   DATE OF PROCEDURE:  05/17/2005  DATE OF DISCHARGE:                                 OPERATIVE REPORT   PREOPERATIVE DIAGNOSIS:  Right knee degenerative joint disease.   POSTOPERATIVE DIAGNOSIS:  Right knee degenerative joint disease.   PROCEDURE:  Right total knee replacement using DePuy cemented total knee  system with #3 cemented femur, #4 cemented tibia with 10 mm polyethylene RP  tibial spacer and 38 mm polyethylene cemented patella.   SURGEON:  Salvatore Marvel, M.D.   ASSISTANT:  Julien Girt, P.A.   ANESTHESIA:  General.   OPERATIVE TIME:  One hour and 30 minutes.   COMPLICATIONS:  None.   DESCRIPTION OF PROCEDURE:  Ms. Troung was brought to the operating room on  May 17, 2005, placed on the operative table in the supine position.  After  an adequate level of general anesthesia was obtained, she received Ancef 1 g  IV preoperatively for prophylaxis.  She had a Foley catheter placed under  sterile conditions.  She had a femoral nerve block placed by anesthesia  under sterile conditions.  Her right knee was examined, range of motion from  -20 to 115 degrees, knee stable ligamentous exam with normal patella  tracking.  The right leg was prepped using sterile DuraPrep and draped using  sterile technique.  The leg was exsanguinated and a thigh tourniquet  elevated 375 mm.  Additionally, through a 15 cm longitudinal incision based  over the patella initial exposure was made.  All subcutaneous tissues were  incised along with skin incision.  A medial arthrotomy was performed  revealing an excessive amount of normal appearing joint fluid.  The  articular surfaces were inspected.  She had grade 4 changes medially, grade  3 changes  laterally and grade 3 and 4 changes in the patellofemoral joint.  The medial and lateral meniscal remnants were removed as well as the  anterior cruciate ligament.  Osteophytes were removed off the femoral  condyles and tibial plateau.  Intermedullary drill was then drilled up the  femoral canal for placement of the distal femoral cutting jig which was  placed in the appropriate amount of rotation and a distal 11 mm cut was  made.  The distal femur was then sized.  A #3 was found to be appropriate  size.  A #3 cutting jig was placed and then these cuts were made.  After  this was done, the proximal tibia was exposed.  The tibial spines were  removed with an oscillating saw.  Intramedullary drill drilled down the  tibial canal, replacing the proximal tibial cutting jig which was placed in  the appropriate amount of rotation and a proximal 6 mm cut was made off the  medial or lower side.  After this was done, then the #4 tibial tray trial  was placed, this was found  to be an excellent fit, the keel cut was then  made.  At this point then the PCL box cutter was placed on the distal femur  and these cuts were made.  At this point, the #3 femoral trial was placed.  With the #4 tibial base plate trial and a 10 mm polyethylene tibial spacer,  there was found to be excellent restoration of normal alignment, excellent  stability, range of motion from -5 to 125 degrees.  The patella was then  sized.  A resurfacing 10 mm cut was made and 3 locking holes were placed for  a 38 mm patella trial.  This was placed, patella tracking was evaluated and  this was found to be normal.  At this point, it was felt that all the trial  components were of excellent size, fit and stability.  They were then  removed.  The knee was then gently lavage irrigated with 3 liters of saline  solution and then the proximal tibia was exposed.  A #4 tibial base plate  was cemented back and was hammered into position with an  excellent fit with  excess cement being removed from around the edges.  The #3 femoral component  was cemented back, it was hammered in position, also with an excellent fit,  with excess cement being removed from around the edges.  The 10 mm  polyethylene RP tibial spacer was then placed on the tibial base plate, knee  taken through a range of motion from -5 to 125 degrees with excellent  stability.  The 38 mm polyethylene cemented back patella was then placed and  held in its position with a clamp.  After the cement hardened,  patellofemoral tracking was evaluated and this was found to be normal.  At  this point, it was felt that all of the components were of excellent size,  fit and stability.  The knee was further irrigated with saline.  The  tourniquet was released.  Small  venous bleeders were cauterized.  The  arthrotomy was then closed with #1 Ethibond suture over two medium Hemovac  drains.  Subcutaneous tissues closed with 0 and 2-0 Vicryl, skin closed with  subcuticular Monocryl, Steri-Strips were applied.  Sterile dressings were  applied.  The Hemovac injected with 0.25% Marcaine with epinephrine and 4 mg  of morphine.  A sterile dressing was applied and the patient awakened and  taken to recovery in stable condition.  Needle and sponge counts correct x2  at the end of the case.       RAW/MEDQ  D:  05/17/2005  T:  05/17/2005  Job:  [id number]

## 2011-04-09 NOTE — Op Note (Signed)
NAME:  Mackenzie Harmon, Mackenzie Harmon                           ACCOUNT NO.:  [id number]   MEDICAL RECORD NO.:  [id number]                   PATIENT TYPE:  AMB   LOCATION:  DSC                                  FACILITY:  [facility]   PHYSICIAN:  Artist Pais. Mina Marble, M.D.           DATE OF BIRTH:  1931-12-22   DATE OF PROCEDURE:  05/22/2003  DATE OF DISCHARGE:                                 OPERATIVE REPORT   PREOPERATIVE DIAGNOSIS:  Left thumb carpometacarpal arthritis.   POSTOPERATIVE DIAGNOSIS:  Left thumb carpometacarpal arthritis.   PROCEDURE:  Left thumb CMC suspensionplasty with APL tendon transfer.   SURGEON:  Artist Pais. Mina Marble, M.D.   ASSISTANT:  Aura Fey. Bobbe Medico.   ANESTHESIA:  General.   TOURNIQUET TIME:  One hour 10 minutes.   COMPLICATIONS:  None.   DRAINS:  None.   DESCRIPTION OF PROCEDURE:  The patient was taken to the operating room where  after the induction of adequate general anesthesia the left upper extremity  was prepped and draped in the usual sterile fashion.  An Esmarch was used to  exsanguinate the limb.  A tourniquet was inflated to 250 mmHg.  At this  point in time a J-shaped incision was made over the thenar eminence of the  left thumb and a large volarly-based flap was elevated.  The thenar muscles  were subperiosteally stripped off the CMC joint.  A transverse CMC  arthrotomy was performed.  Once this was done the trapezium was removed in  piecemeal using a combination of rongeurs, osteotomes, and curettes.  Once  the trapeziectomy was performed a complete CMC synovectomy was performed.  Next, a transosseus canal was created in the thumb in the plane of the  thumbnail dorsally and exiting in the midline of the joint surface at the  metacarpal base.  This was done using a bone awl and sequential hand  drilling and curetting.  After this was done a second transosseus canal was  made through a second incision dorsally over the base of the neck index  metacarpal  under fluoroscopic guidance.  This was also done using awls,  curettes, and hand drills.  At this point in time a third incision was made  over the musculocutaneous junction of the APL tendon.  The APL tendon was  harvested, the musculocutaneous junction passed through the first dorsal  compartment into the distal-most wound.  After this was done the APL tendon  was transferred from dorsal to volar through the thumb metacarpal and volar  to dorsal through the index metacarpal and tied via Pulvertaft weave into  the ECRL insertion at the base of the index metacarpal using 3-0 Ethibond,  thus completing the suspension.  All three wounds were  thoroughly irrigated.  The capsule and thenar muscles were repaired using 4-  0 Vicryl and the skin incisions x3 were closed with a running 3-0 Prolene  subcuticular stitches.  Steri-Strips, 4x4s, fluffs, and radial gutter splint  was applied.  The patient tolerated the procedure well and went to the  recovery room in stable fashion.                                               Artist Pais Mina Marble, M.D.    MAW/MEDQ  D:  05/22/2003  T:  05/22/2003  Job:  [id number]

## 2011-04-09 NOTE — Discharge Summary (Signed)
NAME:  Mackenzie Harmon, Mackenzie Harmon                           ACCOUNT NO.:  [id number]   MEDICAL RECORD NO.:  [id number]                   PATIENT TYPE:  INP   LOCATION:  6715                                 FACILITY:  [facility]   PHYSICIAN:  Elana Alm. Thurston Hole, M.D.              DATE OF BIRTH:  1931/12/02   DATE OF ADMISSION:  05/18/2004  DATE OF DISCHARGE:  05/21/2004                                 DISCHARGE SUMMARY   ADMISSION DIAGNOSES:  1. End-stage degenerative joint disease, left knee.  2. Hypertension.  3. Diabetes.   DISCHARGE DIAGNOSES:  1. End-stage degenerative joint disease left knee.  2. Hypertension.  3. Diabetes.   HISTORY OF PRESENT ILLNESS:  The patient is a 77 year old female who has had  end-stage DJD of both knees.  Her left is currently more painful than her  right.  She has tried conservative care without success.  She understands  the risks, benefits, and possible complications of a left total knee  replacement and is without question.   PROCEDURE:  On May 18, 2004, the patient underwent a left total knee  replacement by Dr. Thurston Hole.  She tolerated the procedure well.   HOSPITAL COURSE:  Postoperatively, she had a femoral nerve block by  anesthesia.  On postoperative day #1, hemoglobin was 10.3, TMAX was 100.8,  her pulse was 107.  She was metabolically stable.  Surgical wound was well  approximated.  Her PCA was discontinued.  Percocet was started for pain.  On  postoperative day #2, temperature was 100.5, surgical wound was well  approximated.  Her hemoglobin was 9.0.  Her INR was 1.2.  She was 2+ max  assist with physical therapy.  The Percocet was not controlling her pain and  this was discontinued.  She was started on Dilaudid 2 mg two tablets q.4h  p.r.n. pain.  Her dressing was changed.  On postoperative day #3, her TMAX  was 100.3, hemoglobin was 8.6.  Her surgical wound is well approximated.  Her potassium was 3.2.  She was supplemented with 30 mEq of potassium  daily.  She was discharged to home in stable condition, weightbearing as tolerated.   DISCHARGE MEDICATIONS:  1. Dilaudid 2 mg one to two q.4h p.r.n. pain.  2. Robaxin 500 mg one q.4-6h p.r.n. muscle spasm.  3. Coumadin 5 mg one p.o. daily.  4. Cozaar 50 mg one p.o. q.a.m.  5. Maxzide 37.5/25 one p.o. q.a.m.  6. Glucotrol 5 mg one p.o. q.a.m.  7. Glucophage 500 mg two tablets twice a day.  8. Avandia 8 mg one tablet a day.  9. Senokot S two tablets before dinner.  10.      Colace 100 mg one tablet twice a day.  11.      Lovenox subcu b.i.d. until Coumadin is therapeutic.   DISCHARGE INSTRUCTIONS:  CPM 0 to 60 degrees eight hours a day, increase by  5  degrees a day.  Elevate left heel on a folded pillow every morning for 30  minutes.  Never put a pillow under her left knee or leg.  She has been  instructed to call with a temperature greater than 101, increased pain,  increased redness, or increased swelling.      Kirstin Shepperson, P.A.                  Robert A. Thurston Hole, M.D.    KS/MEDQ  D:  06/16/2004  T:  06/16/2004  Job:  [id number]

## 2011-04-09 NOTE — Op Note (Signed)
   NAME:  Mackenzie Harmon, Mackenzie Harmon                           ACCOUNT NO.:  [id number]   MEDICAL RECORD NO.:  [id number]                   PATIENT TYPE:  AMB   LOCATION:  DSC                                  FACILITY:  [facility]   PHYSICIAN:  Artist Pais. Mina Marble, M.D.           DATE OF BIRTH:  27-Feb-1932   DATE OF PROCEDURE:  12/26/2002  DATE OF DISCHARGE:                                 OPERATIVE REPORT   PREOPERATIVE DIAGNOSIS:  Right carpal tunnel syndrome.   POSTOPERATIVE DIAGNOSIS:  Right carpal tunnel syndrome.   PROCEDURE:  Right carpal tunnel release.   SURGEON:  Artist Pais. Mina Marble, M.D.   ASSISTANT:  Junius Roads. Bobbe Medico.   ANESTHESIA:  General anesthesia.   TOURNIQUET TIME:  12 minutes.   COMPLICATIONS:  None.   DRAINS:  None.   DESCRIPTION OF PROCEDURE:  The patient was taken to the operating room.  After the induction of general anesthesia, right upper extremity was prepped  in usual sterile fashion.  An Esmarch was used to exsanguinate the limb.  Tourniquet was inflated to 250 mmHg.  At this point in time, a 2 cm incision  was made in the palmar aspect of the right hand in line with the long finger  metacarpal starting at Caplan's cardinal line.  Incision was taken down  through the skin and subcutaneous tissues until the palmar fascia was  identified.  The palmar fascia was split thus exposing the distal edge of  the transverse carpal ligament and superficial palmar arch.  The superficial  palmar arch was retracted distally and a 15 blade was then used to incise  the distal aspect of the transverse carpal ligament.  The median nerve was  exposed and carefully retracted and the remaining aspect of the transverse  carpal ligament was divided under direct vision using a blunt curved  scissor. The canal was inspected.  There were no osseus lesions or ganglions  present.  The wound was irrigated and loosely closed with running 3-0  Prolene subcuticular stitch.  Steri-Strips,  4x4s, fluffs and compressive  dressing was applied.  The patient tolerated the procedure and went to the  recovery room in stable condition.                                               Artist Pais Mina Marble, M.D.    MAW/MEDQ  D:  12/26/2002  T:  12/26/2002  Job:  [id number]

## 2011-04-09 NOTE — Discharge Summary (Signed)
Mackenzie Harmon, DIAMOND NO.:  [id number]   MEDICAL RECORD NO.:  [id number]          PATIENT TYPE:  INP   LOCATION:  5030                         FACILITY:  [facility]   PHYSICIAN:  Elana Alm. Thurston Harmon, M.D. DATE OF BIRTH:  [birth year]   DATE OF ADMISSION:  05/17/2005  DATE OF DISCHARGE:  05/20/2005                                 DISCHARGE SUMMARY   ADMITTING DIAGNOSES:  1.  End stage degenerative joint disease right knee.  2.  Diabetes.  3.  Hypertension.  4.  High cholesterolism.   DISCHARGE DIAGNOSES:  1.  End stage degenerative joint disease right knee.  2.  Diabetes.  3.  Hypertension.  4.  High cholesterolism and obesity.   HISTORY OF PRESENT ILLNESS:  Patient is a 77 year old white female who has a  history of end stage DJD of both knees.  She had a left total knee  replacement and progressed well with it.  Now she understands the risks,  benefits and possible complications of a right total knee replacement and is  without questions.   PROCEDURES IN HOUSE:  On May 17, 2005, the patient underwent a right total  knee replacement by Dr. Thurston Harmon, had a right femoral nerve block by  anesthesia.  She tolerated both procedures well.  Postop day 1, patient was  doing well, had no complaints, vital signs were stable, she was afebrile.  Her hemoglobin was 10.4, she was metabolically stable, her surgical wound  was well approximated, her drain was dc'd, her PCA was dc'd, her dressing  was changed.  Diabetes management was consulted due to her diabetes and  PT/OT was consulted.  Postop day 2, Tmax of 99.1, hemoglobin 9.2, INR is  1.8, her surgical wound was well approximated, her Foley was dc'd, her IV  was Hep Locked, discharge planning for postop day 3 was begun.  Tmax was  101, hemoglobin was 9.1, INR was 2.1.  She is metabolically stable.  Temperature on exam was 97.4, surgical wound was well approximated, she had  a small amount of drainage, no redness, no swelling.   She was discharged to  home in stable condition after she had a bowel movement and physical  therapy.  She has been instructed to call with temp greater than 101,  increased redness, increased swelling, increased pain.   DISCHARGE MEDICATIONS:  1.  Percocet 5/325 1-2 q.4-6 hours p.r.n. pain.  2.  Robaxin 500 mg one q.4-6 hours p.r.n. muscle spasm.  3.  Coumadin 5 mg one tablet p.o. daily.  4.  Actos 30 mg one p.o. daily.  5.  Glipizide 5 mg one p.o. daily.  6.  Metformin 1000 mg twice a day.  7.  Triamterene/hydrochlorothiazide 37.5/25 one tablet daily.  8.  Benicar 20 mg one p.o. daily.  9.  Colace 100 mg one tablet twice a day.  10. CPM 0-90 degrees 8 hours a day, elevate her right heel on a folded      pillow 30 minutes every morning.   She is weightbearing as tolerated, on a regular diabetic diet.  Followup is  July 10th with Dr. Thurston Harmon.      Mackenzie Harmon, P.A.      Mackenzie Harmon, M.D.  Electronically Signed    KS/MEDQ  D:  07/22/2005  T:  07/22/2005  Job:  [id number]

## 2011-08-12 ENCOUNTER — Emergency Department (INDEPENDENT_AMBULATORY_CARE_PROVIDER_SITE_OTHER): Payer: Medicare Other

## 2011-08-12 ENCOUNTER — Emergency Department (HOSPITAL_BASED_OUTPATIENT_CLINIC_OR_DEPARTMENT_OTHER)
Admission: EM | Admit: 2011-08-12 | Discharge: 2011-08-13 | Disposition: A | Discharge status: Home / Self Care | Payer: Medicare Other | Attending: Emergency Medicine | Admitting: Emergency Medicine

## 2011-08-12 ENCOUNTER — Encounter: Payer: Self-pay | Admitting: *Deleted

## 2011-08-12 DIAGNOSIS — M169 Osteoarthritis of hip, unspecified: Secondary | ICD-10-CM | POA: Insufficient documentation

## 2011-08-12 DIAGNOSIS — M545 Low back pain, unspecified: Secondary | ICD-10-CM | POA: Insufficient documentation

## 2011-08-12 DIAGNOSIS — Z79899 Other long term (current) drug therapy: Secondary | ICD-10-CM | POA: Insufficient documentation

## 2011-08-12 DIAGNOSIS — W1809XA Striking against other object with subsequent fall, initial encounter: Secondary | ICD-10-CM | POA: Insufficient documentation

## 2011-08-12 DIAGNOSIS — W19XXXA Unspecified fall, initial encounter: Secondary | ICD-10-CM

## 2011-08-12 DIAGNOSIS — M533 Sacrococcygeal disorders, not elsewhere classified: Secondary | ICD-10-CM | POA: Insufficient documentation

## 2011-08-12 DIAGNOSIS — E119 Type 2 diabetes mellitus without complications: Secondary | ICD-10-CM | POA: Insufficient documentation

## 2011-08-12 DIAGNOSIS — N289 Disorder of kidney and ureter, unspecified: Secondary | ICD-10-CM | POA: Insufficient documentation

## 2011-08-12 DIAGNOSIS — I10 Essential (primary) hypertension: Secondary | ICD-10-CM | POA: Insufficient documentation

## 2011-08-12 DIAGNOSIS — S0990XA Unspecified injury of head, initial encounter: Secondary | ICD-10-CM

## 2011-08-12 DIAGNOSIS — S322XXA Fracture of coccyx, initial encounter for closed fracture: Secondary | ICD-10-CM | POA: Insufficient documentation

## 2011-08-12 DIAGNOSIS — M1610 Unilateral primary osteoarthritis, unspecified hip: Secondary | ICD-10-CM | POA: Insufficient documentation

## 2011-08-12 DIAGNOSIS — F0390 Unspecified dementia without behavioral disturbance: Secondary | ICD-10-CM | POA: Insufficient documentation

## 2011-08-12 DIAGNOSIS — S3210XA Unspecified fracture of sacrum, initial encounter for closed fracture: Secondary | ICD-10-CM | POA: Insufficient documentation

## 2011-08-12 HISTORY — DX: Essential (primary) hypertension: I10

## 2011-08-12 HISTORY — DX: Unspecified dementia, unspecified severity, without behavioral disturbance, psychotic disturbance, mood disturbance, and anxiety: F03.90

## 2011-08-12 MED ORDER — FENTANYL CITRATE 0.05 MG/ML IJ SOLN
50.0000 ug | Freq: Once | INTRAMUSCULAR | Status: AC
  Administered 2011-08-12: 50 ug via INTRAVENOUS
  Filled 2011-08-12: qty 2

## 2011-08-12 NOTE — ED Notes (Signed)
EMS transport from home- stepped on curb and fell backwards and landed on buttocks hit head on asphalt- small lac to posterior scalp- c/o tailbone pain and head pain

## 2011-08-13 ENCOUNTER — Emergency Department (INDEPENDENT_AMBULATORY_CARE_PROVIDER_SITE_OTHER): Payer: Medicare Other

## 2011-08-13 DIAGNOSIS — W19XXXA Unspecified fall, initial encounter: Secondary | ICD-10-CM

## 2011-08-13 DIAGNOSIS — S3210XA Unspecified fracture of sacrum, initial encounter for closed fracture: Secondary | ICD-10-CM

## 2011-08-13 LAB — URINALYSIS, ROUTINE W REFLEX MICROSCOPIC
Bilirubin Urine: NEGATIVE
Hgb urine dipstick: NEGATIVE
Protein, ur: NEGATIVE mg/dL
Urobilinogen, UA: 1 mg/dL (ref 0.0–1.0)

## 2011-08-13 MED ORDER — ACETAMINOPHEN 160 MG/5ML PO SOLN
ORAL | Status: AC
  Filled 2011-08-13: qty 20.3

## 2011-08-13 MED ORDER — ACETAMINOPHEN 325 MG PO TABS
650.0000 mg | ORAL_TABLET | Freq: Once | ORAL | Status: AC
  Administered 2011-08-13: 650 mg via ORAL

## 2011-08-13 NOTE — ED Notes (Signed)
Care plan and pain control reviewed with patient and family supportive at side

## 2011-08-13 NOTE — ED Provider Notes (Addendum)
History     CSN: [id number] Arrival date & time: 08/12/2011 10:15 PM  Chief Complaint  Patient presents with  . Fall  . Tailbone Pain    HPI  (Consider location/radiation/quality/duration/timing/severity/associated sxs/prior treatment)  HPI Comments: Patient had a mechanical fall today. She struck the back of her head and her lower back on the curb. She did not have any loss of consciousness. Patient takes aspirin but no other anticoagulants. She has no other complaints.  Patient is a 77 y.o. female presenting with fall. The history is provided by the patient, the EMS personnel and a relative. No language interpreter was used.  Fall The accident occurred less than 1 hour ago. The fall occurred while walking. She fell from a height of 3 to 5 ft. She landed on concrete. The volume of blood lost was minimal. Point of impact: low back and back of head. Pain location: head and low back. The pain is at a severity of 6/10. She was not ambulatory at the scene. There was no drug use involved in the accident. There was no alcohol use involved in the accident. Pertinent negatives include no loss of consciousness. The symptoms are aggravated by activity. She has tried nothing for the symptoms.    Past Medical History  Diagnosis Date  . Diabetes mellitus   . Hypertension   . Dementia     Past Surgical History  Procedure Date  . Knee surgery   . Abdominal hysterectomy     History reviewed. No pertinent family history.  History  Substance Use Topics  . Smoking status: Former Games developer  . Smokeless tobacco: Not on file  . Alcohol Use: No    OB History    Grav Para Term Preterm Abortions TAB SAB Ect Mult Living                  Review of Systems  Review of Systems  Constitutional: Negative.   HENT: Negative.   Eyes: Negative.   Respiratory: Negative.   Cardiovascular: Negative.   Gastrointestinal: Negative.   Genitourinary: Negative.   Musculoskeletal: Positive for back pain.    Skin: Positive for wound.       On back of head  Neurological: Negative.  Negative for loss of consciousness.  Hematological: Negative.   Psychiatric/Behavioral: Negative.   All other systems reviewed and are negative.    Allergies  Review of patient's allergies indicates no known allergies.  Home Medications   Current Outpatient Rx  Name Route Sig Dispense Refill  . ASPIRIN PO Oral Take by mouth.      Marland Kitchen CITALOPRAM HYDROBROMIDE PO Oral Take by mouth.      . DONEPEZIL HCL PO Oral Take by mouth.      Marland Kitchen LOSARTAN POTASSIUM PO Oral Take by mouth.      . METFORMIN HCL PO Oral Take by mouth.      Marland Kitchen SIMVASTATIN PO Oral Take by mouth.      Marland Kitchen JANUVIA PO Oral Take by mouth.        Physical Exam    BP 199/70  Pulse 58  Temp(Src) 98.2 F (36.8 C) (Oral)  Resp 18  SpO2 100%  Physical Exam  Nursing note and vitals reviewed. Constitutional: She appears well-developed and well-nourished. No distress.  HENT:  Head: Normocephalic. Head is with abrasion.    Eyes: Conjunctivae and EOM are normal. Pupils are equal, round, and reactive to light.  Neck: Normal range of motion. No spinous process tenderness and no  muscular tenderness present.  Cardiovascular: Normal rate, regular rhythm, normal heart sounds and intact distal pulses.  Exam reveals no gallop and no friction rub.   No murmur heard. Pulmonary/Chest: Effort normal and breath sounds normal. No respiratory distress.  Abdominal: Soft. Bowel sounds are normal. She exhibits no distension. There is no tenderness. There is no rebound and no guarding.  Musculoskeletal:       Thoracic back: She exhibits no tenderness.       Lumbar back: She exhibits no tenderness.       Sacrum with tenderness to palpation over the SI joints.  Neurological: She is alert. No cranial nerve deficit. She exhibits normal muscle tone. Coordination normal.       Baseline orientation is to self and location as well as situation. Patient is at her baseline for  this.  Skin: Skin is warm and dry. Abrasion noted. No rash noted.       see HEENT exam  Psychiatric: She has a normal mood and affect.    ED Course  Procedures (including critical care time)   Labs Reviewed  URINALYSIS, ROUTINE W REFLEX MICROSCOPIC   Dg Pelvis 1-2 Views  08/12/2011  *RADIOLOGY REPORT*  Clinical Data: Sacral pain status post fall.  PELVIS - 1-2 VIEW  Comparison: 11/17/2010 CT  Findings: Bilateral mild hip DJD. The sacrum is obscured by overlying bowel gas.  No acute osseous abnormality visualized. Pubic symphysis DJD.  IMPRESSION: Limited single view demonstrates no displaced hip fracture.  Sacrum is obscured by overlying bowel gas.  Original Report Authenticated By: Waneta Martins, M.D.   Ct Head Wo Contrast  08/12/2011  *RADIOLOGY REPORT*  Clinical Data: Fall, trauma to head.  CT HEAD WITHOUT CONTRAST  Technique:  Contiguous axial images were obtained from the base of the skull through the vertex without contrast.  Comparison: None.  Findings: Prominence of the sulci, cisterns, and ventricles, in keeping with volume loss. There are subcortical and periventricular white matter hypodensities, a nonspecific finding most often seen with chronic microangiopathic changes.  There is no evidence for acute hemorrhage, overt hydrocephalus, mass lesion, or abnormal extra-axial fluid collection.  No definite CT evidence for acute cortical based (large artery) infarction. The visualized paranasal sinuses and mastoid air cells are predominately clear. Lens replaced.  No displaced calvarial fracture.  IMPRESSION: White matter hypodensities are nonspecific however most in keeping with chronic microangiopathic change.  No definite acute intracranial abnormality.  Original Report Authenticated By: Waneta Martins, M.D.     No diagnosis found.   MDM Patient was evaluated by myself and given her injuries have a CT of her head as well as plain film of her pelvis. Patient was given 50 mcg  of sentinel IV for pain. Patient's head CT was unremarkable. Evaluation of the posterior aspect of the patient's head revealed an abrasion. Back straight ointment was applied. No tetanus shot was required at this time. Patient did have pelvis film that showed no obvious fractures however sacrum is obscured by bowel gas. I spoke with radiology and we decided that a CT of the sacrum as well as L3-L5 should cover the areas were patient with symptomatic. This returned without any signs of acute fracture though there was evidence of significant degenerative joint disease. Decision to perform CT was made after patient failed ambulation following plain films. Patient was ambulated again following CAT scan with success. At that time the family noted that the patient had urinated quite a bit while she been in  the emergency department. Given this a urinalysis was sent. Patient was given Tylenol 650 mg by mouth. She did not want anything stronger for pain. Urinalysis returned normal. Gestation was about be discharged radiology contacted me to tell me that the patient did have a very small minimally displaced sacral fracture is located very distally. This was shown to both the patient's son and daughter. We discussed that there was minimal likelihood of any neurologic damage from this. Additionally patient was able to ambulate and move around prior to return this result is radiology had initially discussed with him when me and reported that it was negative. Family was still comfortable with plan for discharge home. Patient still wanted nothing stronger for pain and was discharged home in good condition.  Assessment: 77 year-old female with mechanical fall and very small minimally displaced sacral fracture.  Plan: Discharge home in good condition patient continue Tylenol for pain. She can followup with her primary care doctor for any other changes or concerns. Cyndra Numbers, MD 08/13/11 [id number]  Cyndra Numbers, MD 08/13/11  [phone]

## 2011-08-13 NOTE — ED Notes (Signed)
MD at bedside.Pt ambulatory with assist at bedside steady gait.

## 2012-01-18 ENCOUNTER — Emergency Department (HOSPITAL_BASED_OUTPATIENT_CLINIC_OR_DEPARTMENT_OTHER)
Admission: EM | Admit: 2012-01-18 | Discharge: 2012-01-18 | Disposition: A | Discharge status: Home / Self Care | Payer: Medicare Other | Attending: Emergency Medicine | Admitting: Emergency Medicine

## 2012-01-18 ENCOUNTER — Encounter (HOSPITAL_BASED_OUTPATIENT_CLINIC_OR_DEPARTMENT_OTHER): Payer: Self-pay | Admitting: *Deleted

## 2012-01-18 DIAGNOSIS — E119 Type 2 diabetes mellitus without complications: Secondary | ICD-10-CM | POA: Insufficient documentation

## 2012-01-18 DIAGNOSIS — I10 Essential (primary) hypertension: Secondary | ICD-10-CM | POA: Insufficient documentation

## 2012-01-18 DIAGNOSIS — F0390 Unspecified dementia without behavioral disturbance: Secondary | ICD-10-CM | POA: Insufficient documentation

## 2012-01-18 DIAGNOSIS — F4321 Adjustment disorder with depressed mood: Secondary | ICD-10-CM

## 2012-01-18 DIAGNOSIS — Z79899 Other long term (current) drug therapy: Secondary | ICD-10-CM | POA: Insufficient documentation

## 2012-01-18 LAB — CBC
HCT: 36.4 % (ref 36.0–46.0)
MCH: 32 pg (ref 26.0–34.0)
MCHC: 34.1 g/dL (ref 30.0–36.0)
MCV: 94.1 fL (ref 78.0–100.0)
RDW: 12.9 % (ref 11.5–15.5)

## 2012-01-18 LAB — DIFFERENTIAL
Basophils Absolute: 0 10*3/uL (ref 0.0–0.1)
Basophils Relative: 0 % (ref 0–1)
Eosinophils Relative: 1 % (ref 0–5)
Lymphocytes Relative: 14 % (ref 12–46)
Monocytes Absolute: 0.5 10*3/uL (ref 0.1–1.0)

## 2012-01-18 LAB — COMPREHENSIVE METABOLIC PANEL
AST: 24 U/L (ref 0–37)
CO2: 29 mEq/L (ref 19–32)
Calcium: 9.7 mg/dL (ref 8.4–10.5)
Creatinine, Ser: 1 mg/dL (ref 0.50–1.10)
GFR calc Af Amer: 61 mL/min — ABNORMAL LOW (ref >90)
GFR calc non Af Amer: 53 mL/min — ABNORMAL LOW (ref >90)

## 2012-01-18 LAB — URINALYSIS, ROUTINE W REFLEX MICROSCOPIC
Bilirubin Urine: NEGATIVE
Glucose, UA: NEGATIVE mg/dL
Hgb urine dipstick: NEGATIVE
Ketones, ur: 15 mg/dL — AB
Protein, ur: 100 mg/dL — AB

## 2012-01-18 LAB — URINE MICROSCOPIC-ADD ON

## 2012-01-18 NOTE — ED Notes (Signed)
Pt here with daughters from [facility] she has recently gone from living in a condo independently with private caregivers in the morning and one of her children in the afternoons. Daughters state that she is having a bit of difficulty adjusting to the decrease in her independence. She went to the dining room this morning and looked in but wouldn't go in staff reported they coaxed her in but patient looked in staff reported she said she would rather go on and die than have to be in here with all these old nursing home people. They called her daughters and wanted and to have her sent out for evaluation daughters refused saying they will bring her to the doctor to see what is going on. Pt states she doesn't know why she said that other than she looked in there and all she saw were people older and in worse shape than she is and she just wishes she could have stayed at home but she knows she needs the extra help. Pt does have dementia. Is alert and cooperative daughters concerned she may have a uti as has a strong smell to her urine. And request we get that checked. Pt knows her daughters knows where she is and is completely appropriate

## 2012-01-18 NOTE — ED Provider Notes (Signed)
History     CSN: [id number]  Arrival date & time 01/18/12  1052   First MD Initiated Contact with Patient 01/18/12 1203      Chief Complaint  Patient presents with  . Urinary Tract Infection    (Consider location/radiation/quality/duration/timing/severity/associated sxs/prior treatment) HPI Comments: The patient is brought here by family for eval of depression, possible uti.  She recently moved into an assisted living facility where she has been depressed.  Today she went to the lunch room and saw the people in the dining area.  She made the comment that she would rather be dead than go in there. She was then sent here for eval of possible uti, suicidal ideation.  She denies any pain or discomfort.  No other complaints.  The history is provided by the patient, the nursing home and a relative.    Past Medical History  Diagnosis Date  . Diabetes mellitus   . Hypertension   . Dementia     Past Surgical History  Procedure Date  . Knee surgery   . Abdominal hysterectomy     History reviewed. No pertinent family history.  History  Substance Use Topics  . Smoking status: Former Games developer  . Smokeless tobacco: Not on file  . Alcohol Use: No    OB History    Grav Para Term Preterm Abortions TAB SAB Ect Mult Living                  Review of Systems  All other systems reviewed and are negative.    Allergies  Review of patient's allergies indicates no known allergies.  Home Medications   Current Outpatient Rx  Name Route Sig Dispense Refill  . ASPIRIN PO Oral Take by mouth.      Marland Kitchen CITALOPRAM HYDROBROMIDE PO Oral Take by mouth.      . DONEPEZIL HCL PO Oral Take by mouth.      Marland Kitchen LOSARTAN POTASSIUM PO Oral Take by mouth.      . METFORMIN HCL PO Oral Take by mouth.      Marland Kitchen SIMVASTATIN PO Oral Take by mouth.      Marland Kitchen JANUVIA PO Oral Take by mouth.        Pulse 74  Temp(Src) 98.2 F (36.8 C) (Oral)  Resp 20  SpO2 97%  Physical Exam  Nursing note and vitals  reviewed. Constitutional: She is oriented to person, place, and time. She appears well-developed and well-nourished. No distress.  HENT:  Head: Normocephalic and atraumatic.  Eyes: EOM are normal. Pupils are equal, round, and reactive to light.  Neck: Normal range of motion. Neck supple.  Cardiovascular: Normal rate and regular rhythm.   No murmur heard. Pulmonary/Chest: Effort normal and breath sounds normal. No respiratory distress.  Abdominal: Soft. Bowel sounds are normal. She exhibits no distension.  Musculoskeletal: She exhibits no edema.  Neurological: She is alert and oriented to person, place, and time. No cranial nerve deficit. Coordination normal.  Skin: Skin is warm and dry. She is not diaphoretic.    ED Course  Procedures (including critical care time)   Labs Reviewed  URINALYSIS, ROUTINE W REFLEX MICROSCOPIC  CBC  DIFFERENTIAL  COMPREHENSIVE METABOLIC PANEL   No results found.   No diagnosis found.    MDM  The labs and ua look okay.  It seems as though the patient is depressed over her new living situation.  She is due to see her pcp tomorrow, at which time they may want to  discuss medication adjustments.  To return to the ED prn.        Geoffery Lyons, MD 01/18/12 [phone]

## 2012-01-18 NOTE — Discharge Instructions (Signed)
Follow up with your primary doctor as scheduled to discuss a possible medication adjustment.

## 2012-01-18 NOTE — ED Notes (Signed)
Documentation in epic still shows a periphereal iv line in place from sept 2012 there is no iv line therefore documentation provided to ensure that next provider is aware that there is no periphereal line

## 2012-01-28 ENCOUNTER — Encounter (HOSPITAL_BASED_OUTPATIENT_CLINIC_OR_DEPARTMENT_OTHER): Payer: Self-pay | Admitting: *Deleted

## 2012-01-28 ENCOUNTER — Emergency Department (HOSPITAL_BASED_OUTPATIENT_CLINIC_OR_DEPARTMENT_OTHER)
Admission: EM | Admit: 2012-01-28 | Discharge: 2012-01-28 | Disposition: A | Discharge status: Home / Self Care | Payer: Medicare Other | Attending: Emergency Medicine | Admitting: Emergency Medicine

## 2012-01-28 DIAGNOSIS — I10 Essential (primary) hypertension: Secondary | ICD-10-CM | POA: Insufficient documentation

## 2012-01-28 DIAGNOSIS — F0390 Unspecified dementia without behavioral disturbance: Secondary | ICD-10-CM | POA: Insufficient documentation

## 2012-01-28 DIAGNOSIS — Z043 Encounter for examination and observation following other accident: Secondary | ICD-10-CM | POA: Insufficient documentation

## 2012-01-28 DIAGNOSIS — Z7982 Long term (current) use of aspirin: Secondary | ICD-10-CM | POA: Insufficient documentation

## 2012-01-28 DIAGNOSIS — W06XXXA Fall from bed, initial encounter: Secondary | ICD-10-CM | POA: Insufficient documentation

## 2012-01-28 DIAGNOSIS — W19XXXA Unspecified fall, initial encounter: Secondary | ICD-10-CM

## 2012-01-28 DIAGNOSIS — E119 Type 2 diabetes mellitus without complications: Secondary | ICD-10-CM | POA: Insufficient documentation

## 2012-01-28 DIAGNOSIS — Y9210 Unspecified residential institution as the place of occurrence of the external cause: Secondary | ICD-10-CM | POA: Insufficient documentation

## 2012-01-28 NOTE — Discharge Instructions (Signed)
You have no evidence of a significant range, injury or any broken bones.  Use Tylenol for pain.  Followup with your Dr. as needed.

## 2012-01-28 NOTE — ED Provider Notes (Signed)
History     CSN: [id number]  Arrival date & time 01/28/12  7829   First MD Initiated Contact with Patient 01/28/12 [phone]      Chief Complaint  Patient presents with  . Fall  . Abrasion    (Consider location/radiation/quality/duration/timing/severity/associated sxs/prior treatment) Patient is a 78 y.o. female presenting with fall. The history is provided by the patient, a relative and the nursing home.  Fall Pertinent negatives include no abdominal pain, no nausea, no vomiting and no headaches.   the patient is a 78 year old, female, with diabetes, hypertension, and dementia, who was sent to the emergency department for evaluation after she fell out of bed.  She denies loss of consciousness.  She denies any pain anywhere.  She denies nausea, vision changes, weakness, or numbness.  Her daughter states that she is taking aspirin, but no other anticoagulants.  Her daughter states that she is more coherent now than she usually is.  Past Medical History  Diagnosis Date  . Diabetes mellitus   . Hypertension   . Dementia     Past Surgical History  Procedure Date  . Knee surgery   . Abdominal hysterectomy     History reviewed. No pertinent family history.  History  Substance Use Topics  . Smoking status: Former Games developer  . Smokeless tobacco: Not on file  . Alcohol Use: No    OB History    Grav Para Term Preterm Abortions TAB SAB Ect Mult Living                  Review of Systems  HENT: Negative for neck pain.   Eyes: Negative for visual disturbance.  Cardiovascular: Negative for chest pain.  Gastrointestinal: Negative for nausea, vomiting and abdominal pain.  Neurological: Negative for headaches.  Hematological: Does not bruise/bleed easily.  All other systems reviewed and are negative.    Allergies  Review of patient's allergies indicates no known allergies.  Home Medications   Current Outpatient Rx  Name Route Sig Dispense Refill  . ASPIRIN PO Oral Take by  mouth.      Marland Kitchen CITALOPRAM HYDROBROMIDE PO Oral Take by mouth.      . DONEPEZIL HCL PO Oral Take by mouth.      Marland Kitchen LOSARTAN POTASSIUM PO Oral Take by mouth.      . METFORMIN HCL PO Oral Take by mouth.      Marland Kitchen SIMVASTATIN PO Oral Take by mouth.      Marland Kitchen JANUVIA PO Oral Take by mouth.        BP 153/74  Pulse 59  Temp(Src) 97.9 F (36.6 C) (Oral)  Resp 16  SpO2 100%  Physical Exam  Constitutional: She appears well-developed and well-nourished.  HENT:  Head: Normocephalic and atraumatic.  Eyes: Pupils are equal, round, and reactive to light.  Neck: Normal range of motion.  Cardiovascular: Normal rate.   No murmur heard. Pulmonary/Chest: Effort normal. No respiratory distress. She exhibits no tenderness.  Abdominal: Soft. She exhibits no distension. There is no tenderness.  Musculoskeletal: Normal range of motion. She exhibits no edema and no tenderness.  Neurological: She is alert. No cranial nerve deficit.  Skin: Skin is warm and dry.  Psychiatric: She has a normal mood and affect.    ED Course  Procedures (including critical care time) 78 year old, female fell out of bed.  She is not on anticoagulants.  There is no physical evidence of severe injury.    No indications for x-rays or CAT scans.  At this time.  I discussed this with her daughter and she agrees with no testing in the emergency department   Labs Reviewed - No data to display No results found.   No diagnosis found.    MDM  Fall No evidence of significant injury        Nicholes Stairs, MD 01/28/12 0330

## 2012-01-28 NOTE — ED Notes (Signed)
Pt here from [facility] s/p fall. Pt c/o skin tear to left arm. Denies LOC, no other injuries.

## 2012-01-29 ENCOUNTER — Encounter (HOSPITAL_BASED_OUTPATIENT_CLINIC_OR_DEPARTMENT_OTHER): Payer: Self-pay | Admitting: *Deleted

## 2012-01-29 ENCOUNTER — Emergency Department (INDEPENDENT_AMBULATORY_CARE_PROVIDER_SITE_OTHER): Payer: Medicare Other

## 2012-01-29 ENCOUNTER — Other Ambulatory Visit: Payer: Self-pay

## 2012-01-29 ENCOUNTER — Observation Stay (HOSPITAL_BASED_OUTPATIENT_CLINIC_OR_DEPARTMENT_OTHER)
Admission: EM | Admit: 2012-01-29 | Discharge: 2012-02-02 | Disposition: A | Discharge status: Skilled Nursing Facility | Payer: Medicare Other | Source: Ambulatory Visit | Attending: Internal Medicine | Admitting: Internal Medicine

## 2012-01-29 DIAGNOSIS — W19XXXA Unspecified fall, initial encounter: Secondary | ICD-10-CM

## 2012-01-29 DIAGNOSIS — F0390 Unspecified dementia without behavioral disturbance: Secondary | ICD-10-CM

## 2012-01-29 DIAGNOSIS — G4733 Obstructive sleep apnea (adult) (pediatric): Secondary | ICD-10-CM | POA: Insufficient documentation

## 2012-01-29 DIAGNOSIS — R531 Weakness: Secondary | ICD-10-CM

## 2012-01-29 DIAGNOSIS — F329 Major depressive disorder, single episode, unspecified: Secondary | ICD-10-CM | POA: Diagnosis present

## 2012-01-29 DIAGNOSIS — R42 Dizziness and giddiness: Secondary | ICD-10-CM

## 2012-01-29 DIAGNOSIS — I10 Essential (primary) hypertension: Secondary | ICD-10-CM

## 2012-01-29 DIAGNOSIS — F29 Unspecified psychosis not due to a substance or known physiological condition: Secondary | ICD-10-CM | POA: Insufficient documentation

## 2012-01-29 DIAGNOSIS — R079 Chest pain, unspecified: Secondary | ICD-10-CM

## 2012-01-29 DIAGNOSIS — G319 Degenerative disease of nervous system, unspecified: Secondary | ICD-10-CM

## 2012-01-29 DIAGNOSIS — R5381 Other malaise: Secondary | ICD-10-CM

## 2012-01-29 DIAGNOSIS — Z9181 History of falling: Secondary | ICD-10-CM | POA: Insufficient documentation

## 2012-01-29 DIAGNOSIS — E785 Hyperlipidemia, unspecified: Secondary | ICD-10-CM | POA: Diagnosis present

## 2012-01-29 DIAGNOSIS — Z23 Encounter for immunization: Secondary | ICD-10-CM | POA: Insufficient documentation

## 2012-01-29 DIAGNOSIS — R262 Difficulty in walking, not elsewhere classified: Secondary | ICD-10-CM | POA: Insufficient documentation

## 2012-01-29 DIAGNOSIS — M6281 Muscle weakness (generalized): Secondary | ICD-10-CM | POA: Insufficient documentation

## 2012-01-29 DIAGNOSIS — Z79899 Other long term (current) drug therapy: Secondary | ICD-10-CM | POA: Insufficient documentation

## 2012-01-29 DIAGNOSIS — R4182 Altered mental status, unspecified: Secondary | ICD-10-CM

## 2012-01-29 DIAGNOSIS — F3289 Other specified depressive episodes: Secondary | ICD-10-CM | POA: Insufficient documentation

## 2012-01-29 DIAGNOSIS — E119 Type 2 diabetes mellitus without complications: Secondary | ICD-10-CM | POA: Insufficient documentation

## 2012-01-29 DIAGNOSIS — G4730 Sleep apnea, unspecified: Secondary | ICD-10-CM | POA: Diagnosis present

## 2012-01-29 LAB — CBC
MCH: 32.2 pg (ref 26.0–34.0)
MCHC: 34.2 g/dL (ref 30.0–36.0)
MCV: 94.1 fL (ref 78.0–100.0)
Platelets: 188 10*3/uL (ref 150–400)
RDW: 13.2 % (ref 11.5–15.5)
WBC: 5.3 10*3/uL (ref 4.0–10.5)

## 2012-01-29 LAB — URINALYSIS, ROUTINE W REFLEX MICROSCOPIC
Leukocytes, UA: NEGATIVE
Protein, ur: 30 mg/dL — AB
Urobilinogen, UA: 1 mg/dL (ref 0.0–1.0)

## 2012-01-29 LAB — TROPONIN I: Troponin I: <0.3 ng/mL (ref <0.30)

## 2012-01-29 LAB — DIFFERENTIAL
Basophils Absolute: 0 10*3/uL (ref 0.0–0.1)
Basophils Relative: 0 % (ref 0–1)
Eosinophils Absolute: 0.1 10*3/uL (ref 0.0–0.7)
Eosinophils Relative: 2 % (ref 0–5)

## 2012-01-29 LAB — BASIC METABOLIC PANEL
Calcium: 9.6 mg/dL (ref 8.4–10.5)
GFR calc non Af Amer: 47 mL/min — ABNORMAL LOW (ref >90)
Sodium: 141 mEq/L (ref 135–145)

## 2012-01-29 LAB — URINE MICROSCOPIC-ADD ON

## 2012-01-29 MED ORDER — ASPIRIN 81 MG PO CHEW
81.0000 mg | CHEWABLE_TABLET | Freq: Every day | ORAL | Status: DC
  Administered 2012-01-30 – 2012-02-02 (×4): 81 mg via ORAL
  Filled 2012-01-29 (×4): qty 1

## 2012-01-29 MED ORDER — CITALOPRAM HYDROBROMIDE 20 MG PO TABS
20.0000 mg | ORAL_TABLET | Freq: Every day | ORAL | Status: DC
  Administered 2012-01-30 – 2012-02-02 (×4): 20 mg via ORAL
  Filled 2012-01-29 (×4): qty 1

## 2012-01-29 MED ORDER — METFORMIN HCL 500 MG PO TABS
1000.0000 mg | ORAL_TABLET | Freq: Two times a day (BID) | ORAL | Status: DC
  Administered 2012-01-30 – 2012-02-02 (×7): 1000 mg via ORAL
  Filled 2012-01-29 (×9): qty 2

## 2012-01-29 MED ORDER — DOCUSATE SODIUM 100 MG PO CAPS: 100.0000 mg | ORAL_CAPSULE | ORAL | Status: DC

## 2012-01-29 MED ORDER — DONEPEZIL HCL 10 MG PO TABS
10.0000 mg | ORAL_TABLET | Freq: Every day | ORAL | Status: DC
  Administered 2012-01-30 – 2012-02-01 (×3): 10 mg via ORAL
  Filled 2012-01-29 (×4): qty 1

## 2012-01-29 MED ORDER — HYDROCODONE-ACETAMINOPHEN 5-325 MG PO TABS: 1.0000 | ORAL_TABLET | ORAL | Status: DC | PRN

## 2012-01-29 MED ORDER — SODIUM CHLORIDE 0.9 % IV SOLN
INTRAVENOUS | Status: AC
  Administered 2012-01-30: 01:00:00 via INTRAVENOUS

## 2012-01-29 MED ORDER — LOSARTAN POTASSIUM 50 MG PO TABS
50.0000 mg | ORAL_TABLET | Freq: Two times a day (BID) | ORAL | Status: DC
  Administered 2012-01-30 – 2012-02-02 (×8): 50 mg via ORAL
  Filled 2012-01-29 (×9): qty 1

## 2012-01-29 MED ORDER — ALUM & MAG HYDROXIDE-SIMETH 200-200-20 MG/5ML PO SUSP: 30.0000 mL | Freq: Four times a day (QID) | ORAL | Status: DC | PRN

## 2012-01-29 MED ORDER — ASPIRIN 81 MG PO TABS: 81.0000 mg | ORAL_TABLET | Freq: Every day | ORAL | Status: DC

## 2012-01-29 MED ORDER — ACETAMINOPHEN 650 MG RE SUPP: 650.0000 mg | Freq: Four times a day (QID) | RECTAL | Status: DC | PRN

## 2012-01-29 MED ORDER — SIMVASTATIN 40 MG PO TABS
40.0000 mg | ORAL_TABLET | Freq: Every evening | ORAL | Status: DC
  Administered 2012-01-30: 40 mg via ORAL
  Filled 2012-01-29 (×2): qty 1

## 2012-01-29 MED ORDER — ACETAMINOPHEN 325 MG PO TABS
650.0000 mg | ORAL_TABLET | Freq: Four times a day (QID) | ORAL | Status: DC | PRN
  Filled 2012-01-29 (×2): qty 2

## 2012-01-29 MED ORDER — ALPRAZOLAM 0.5 MG PO TABS
0.5000 mg | ORAL_TABLET | Freq: Every day | ORAL | Status: DC | PRN
  Administered 2012-01-30: 0.5 mg via ORAL
  Filled 2012-01-29: qty 1

## 2012-01-29 MED ORDER — DOCUSATE SODIUM 100 MG PO CAPS
100.0000 mg | ORAL_CAPSULE | Freq: Two times a day (BID) | ORAL | Status: DC
  Administered 2012-01-30 – 2012-02-02 (×7): 100 mg via ORAL
  Filled 2012-01-29 (×4): qty 1

## 2012-01-29 MED ORDER — ONDANSETRON HCL 4 MG PO TABS: 4.0000 mg | ORAL_TABLET | Freq: Four times a day (QID) | ORAL | Status: DC | PRN

## 2012-01-29 MED ORDER — ONDANSETRON HCL 4 MG/2ML IJ SOLN: 4.0000 mg | Freq: Four times a day (QID) | INTRAMUSCULAR | Status: DC | PRN

## 2012-01-29 MED ORDER — ENOXAPARIN SODIUM 40 MG/0.4ML ~~LOC~~ SOLN
40.0000 mg | SUBCUTANEOUS | Status: DC
  Administered 2012-01-30 – 2012-02-02 (×4): 40 mg via SUBCUTANEOUS
  Filled 2012-01-29 (×4): qty 0.4

## 2012-01-29 MED ORDER — ALBUTEROL SULFATE (5 MG/ML) 0.5% IN NEBU: 2.5000 mg | INHALATION_SOLUTION | RESPIRATORY_TRACT | Status: DC | PRN

## 2012-01-29 MED ORDER — POLYETHYLENE GLYCOL 3350 17 G PO PACK
17.0000 g | PACK | Freq: Every day | ORAL | Status: DC | PRN
  Administered 2012-01-31 – 2012-02-02 (×2): 17 g via ORAL
  Filled 2012-01-29 (×2): qty 1

## 2012-01-29 MED ORDER — POLYETHYLENE GLYCOL 3350 17 GM/SCOOP PO POWD: 17.0000 g | Freq: Every day | ORAL | Status: DC | PRN

## 2012-01-29 MED ORDER — GUAIFENESIN-DM 100-10 MG/5ML PO SYRP: 5.0000 mL | ORAL_SOLUTION | ORAL | Status: DC | PRN

## 2012-01-29 NOTE — ED Notes (Signed)
Patient at MRI 

## 2012-01-29 NOTE — ED Notes (Signed)
Family states that pt fell on Thursday and was brought to this ED. Has fallen twice since and was found wandering from facility. Would not respond initially, but is at present.

## 2012-01-29 NOTE — H&P (Signed)
PCP:   Hoyle Sauer, MD, MD  [facility]   Chief Complaint:   debility  HPI: Mackenzie Harmon is a 78 y.o. female   has a past medical history of Diabetes mellitus; Hypertension; and Dementia.   Presented with  Progressive weakness over the past 3 weeks at this point is unable to walk.  Came in to [facility] worried about worsening confusion.  Have been falling more frequently. Lives currently at Assisted living at [facility] 3 weeks ago was able to be in Independent living. Now has trouble taking care of heh self and not able to ambulate due to generalized weakness. Worsening anexiety and was started on xanax, after failed wellbuterine.   Review of Systems:    Pertinent positives include: fatigue, confused with occasional abnormal speech.   Constitutional:  No weight loss, night sweats, Fevers, chills,  weight loss  HEENT:  No headaches, Difficulty swallowing,Tooth/dental problems,Sore throat,  No sneezing, itching, ear ache, nasal congestion, post nasal drip,  Cardio-vascular:  No chest pain, Orthopnea, PND, anasarca, dizziness, palpitations.no Bilateral lower extremity swelling  GI:  No heartburn, indigestion, abdominal pain, nausea, vomiting, diarrhea, change in bowel habits, loss of appetite, melena, blood in stool, hematoemesis Resp:  no shortness of breath at rest. No dyspnea on exertion, No excess mucus, no productive cough, No non-productive cough, No coughing up of blood.No change in color of mucus.No wheezing. Skin:  no rash or lesions. No jaundice GU:  no dysuria, change in color of urine, no urgency or frequency. No straining to urinate.  No flank pain.  Musculoskeletal:  No joint pain or no joint swelling. No decreased range of motion. No back pain.  Psych:  No change in mood or affect. No depression or anxiety. No memory loss.  Neuro: no localizing neurological complaints, no tingling, no weakness, no double vision, no gait abnormality, no slurred speech,  no confusion  Otherwise ROS are negative except for above, 10 systems were reviewed  Past Medical History: Past Medical History  Diagnosis Date  . Diabetes mellitus   . Hypertension   . Dementia    Past Surgical History  Procedure Date  . Knee surgery   . Abdominal hysterectomy      Medications: Prior to Admission medications   Medication Sig Start Date End Date Taking? Authorizing Provider  ALPRAZolam Prudy Feeler) 0.5 MG tablet Take 0.5 mg by mouth daily as needed. For anxiety   Yes Historical Provider, MD  aspirin 81 MG tablet Take 81 mg by mouth daily.   Yes Historical Provider, MD  citalopram (CELEXA) 20 MG tablet Take 20 mg by mouth daily.   Yes Historical Provider, MD  docusate sodium (COLACE) 100 MG capsule Take 100 mg by mouth 2 (two) times a week. As needed for constipation   Yes Historical Provider, MD  donepezil (ARICEPT) 10 MG tablet Take 10 mg by mouth at bedtime.   Yes Historical Provider, MD  losartan (COZAAR) 50 MG tablet Take 50 mg by mouth 2 (two) times daily.   Yes Historical Provider, MD  metFORMIN (GLUCOPHAGE) 1000 MG tablet Take 1,000 mg by mouth 2 (two) times daily.   Yes Historical Provider, MD  polyethylene glycol powder (GLYCOLAX/MIRALAX) powder Take 17 g by mouth daily as needed.   Yes Historical Provider, MD  simvastatin (ZOCOR) 40 MG tablet Take 40 mg by mouth every evening.   Yes Historical Provider, MD  sitaGLIPtin (JANUVIA) 100 MG tablet Take 100 mg by mouth daily.   Yes Historical Provider,  MD  Vitamin D, Ergocalciferol, (DRISDOL) 50000 UNITS CAPS Take 50,000 Units by mouth every 14 (fourteen) days. On Tuesdays and Saturdays   Yes Historical Provider, MD    Allergies:  No Known Allergies  Social History:  Ambulatory no currently Lives at [facility]   reports that she has never smoked. She does not have any smokeless tobacco history on file. She reports that she does not drink alcohol or use illicit drugs.   Family History: family  history includes Heart disease in her brother.    Physical Exam: Patient Vitals for the past 24 hrs:  BP Temp Temp src Pulse Resp SpO2 Weight  01/29/12 2220 166/71 mmHg 98.2 F (36.8 C) Oral 58  20  96 % 70 kg (154 lb 5.2 oz)  01/29/12 2058 179/65 mmHg - - 56  16  98 % -  01/29/12 1941 176/77 mmHg - - 71  - - -  01/29/12 1940 180/78 mmHg - - 63  - - -  01/29/12 1940 174/76 mmHg - - 60  - - -    1. General:  in No Acute distress 2. Psychological: Alert but not Oriented 3. Head/ENT:   Moist  Mucous Membranes                          Head Non traumatic, neck supple                          Normal Dentition 4. SKIN: * decreased Skin turgor,  Skin clean Dry and intact no rash 5. Heart: Regular rate and rhythm no Murmur, Rub or gallop 6. Lungs: Clear to auscultation bilaterally, no wheezes or crackles   7. Abdomen: Soft, non-tender, Non distended 8. Lower extremities: no clubbing, cyanosis, or edema 9. Neurologically Grossly intact, moving all 4 extremities equally, equal grips bilaterally strength full extremity equal bilaterally cranial nerves II through XII intact 10. MSK: Normal range of motion  body mass index is unknown because there is no height on file.   Labs on Admission:   [facility] 01/30/12 0106 01/29/12 1759  NA -- 141  K -- 4.0  CL -- 104  CO2 -- 26  GLUCOSE -- 91  BUN -- 23  CREATININE 1.00 1.10  CALCIUM -- 9.6  MG -- --  PHOS -- --   No results found for this basename: AST:2,ALT:2,ALKPHOS:2,BILITOT:2,PROT:2,ALBUMIN:2 in the last 72 hours No results found for this basename: LIPASE:2,AMYLASE:2 in the last 72 hours  Basename 01/30/12 0106 01/29/12 1759  WBC 5.0 5.3  NEUTROABS -- 3.5  HGB 10.9* 11.5*  HCT 32.3* 33.6*  MCV 93.4 94.1  PLT 163 188    Basename 01/29/12 1759  CKTOTAL --  CKMB --  CKMBINDEX --  TROPONINI <0.30   No results found for this basename: TSH,T4TOTAL,FREET3,T3FREE,THYROIDAB in the last 72 hours No results found for this basename:  VITAMINB12:2,FOLATE:2,FERRITIN:2,TIBC:2,IRON:2,RETICCTPCT:2 in the last 72 hours No results found for this basename: HGBA1C    The CrCl is unknown because both a height and weight (above a minimum accepted value) are required for this calculation. ABG No results found for this basename: phart, pco2, po2, hco3, tco2, acidbasedef, o2sat     No results found for this basename: DDIMER     Other results:  UA no infection   Cultures:    Component Value Date/Time   SDES FLUID PLEURAL RIGHT 10/22/2010 1657   SPECREQUEST FLUID IN SYR @  10/22/2010 1657   CULT NO GROWTH 3 DAYS 10/22/2010 1657   REPTSTATUS 10/26/2010 FINAL 10/22/2010 1657       Radiological Exams on Admission: Dg Chest 2 View  01/29/2012  *RADIOLOGY REPORT*  Clinical Data: Larey Seat.  Mental status changes.  CHEST - 2 VIEW  Comparison: 11/25/2010  Findings: Heart size is normal.  The mediastinum is unremarkable except for unfolding of the thoracic aorta.  The vascularity is normal.  Lungs are clear.  No effusions.  Ordinary degenerative changes effect the spine.  IMPRESSION: No active disease  Original Report Authenticated By: Thomasenia Sales, M.D.   Ct Head Wo Contrast  01/29/2012  *RADIOLOGY REPORT*  Clinical Data: Recent fall.  Lethargy.  Dementia.  CT HEAD WITHOUT CONTRAST  Technique:  Contiguous axial images were obtained from the base of the skull through the vertex without contrast.  Comparison: 08/12/2011  Findings: The brain shows generalized atrophy.  There is moderate chronic appearing small vessel change within the deep white matter. No cortical or large vessel territory infarction.  No mass lesion, hemorrhage, hydrocephalus or extra-axial collection.  No skull fracture.  Sinuses, middle ears and mastoids are clear.  IMPRESSION: Atrophy and chronic appearing small vessel change of the white matter.  No sign of acute or reversible process.  Original Report Authenticated By: Thomasenia Sales, M.D.   Mr Angiogram Head Wo  Contrast  01/29/2012  *RADIOLOGY REPORT*  Clinical Data:  .  Recent fall.  Dizziness and fusion.  Dementia. Hypertension and diabetes.  MRI HEAD WITHOUT CONTRAST MRA HEAD WITHOUT CONTRAST  Technique:  Multiplanar, multiecho pulse sequences of the brain and surrounding structures were obtained without intravenous contrast. Angiographic images of the head were obtained using MRA technique without contrast.  Comparison:  Head CT same date.  Head CT 08/12/2011.  MRI HEAD  Findings:  Diffusion imaging does not show any acute or subacute infarction.  The brain shows generalized atrophy.  There are mild chronic small vessel changes in the pons.  No cerebellar abnormality.  The cerebral hemispheres show a few old small vessel insults within the thalami and moderate small vessel changes in the hemispheric deep white matter.  No cortical or large vessel territory infarction.  No mass lesion, hemorrhage, hydrocephalus or extra-axial collection.  No pituitary mass.  No inflammatory disease.  No skull or skull base lesion.  IMPRESSION: No acute or reversible findings.  Atrophy and chronic small vessel change.  MRA HEAD  Findings: Both internal carotid arteries are widely patent into the brain.  The anterior middle cerebral vessels are patent without proximal stenosis, aneurysm or vascular malformation.  Both vertebral arteries are patent to the basilar.  No basilar stenosis. Posterior circulation branch vessels are patent.  IMPRESSION: Normal intracranial MR angiography of the large and medium-sized vessels.  Original Report Authenticated By: Thomasenia Sales, M.D.   Mr Brain Wo Contrast  01/29/2012  *RADIOLOGY REPORT*  Clinical Data:  .  Recent fall.  Dizziness and fusion.  Dementia. Hypertension and diabetes.  MRI HEAD WITHOUT CONTRAST MRA HEAD WITHOUT CONTRAST  Technique:  Multiplanar, multiecho pulse sequences of the brain and surrounding structures were obtained without intravenous contrast. Angiographic images of the head  were obtained using MRA technique without contrast.  Comparison:  Head CT same date.  Head CT 08/12/2011.  MRI HEAD  Findings:  Diffusion imaging does not show any acute or subacute infarction.  The brain shows generalized atrophy.  There are mild chronic small vessel changes in the  pons.  No cerebellar abnormality.  The cerebral hemispheres show a few old small vessel insults within the thalami and moderate small vessel changes in the hemispheric deep white matter.  No cortical or large vessel territory infarction.  No mass lesion, hemorrhage, hydrocephalus or extra-axial collection.  No pituitary mass.  No inflammatory disease.  No skull or skull base lesion.  IMPRESSION: No acute or reversible findings.  Atrophy and chronic small vessel change.  MRA HEAD  Findings: Both internal carotid arteries are widely patent into the brain.  The anterior middle cerebral vessels are patent without proximal stenosis, aneurysm or vascular malformation.  Both vertebral arteries are patent to the basilar.  No basilar stenosis. Posterior circulation branch vessels are patent.  IMPRESSION: Normal intracranial MR angiography of the large and medium-sized vessels.  Original Report Authenticated By: Thomasenia Sales, M.D.    Assessment/Plan  78 year old female with history of dementia now acutely progressive over past 3 weeks with debility, may need increase level of care  Present on Admission:  Debility - will order PT OT evaluation. Patient had a negative MRI and CT scan. There is no acute neurological deficits. Spoke her family who does not wish to have TIA/CVA workup at this point.  Marland KitchenHYPERTENSION - continue cozaar .OBSTRUCTIVE SLEEP APNEA - will need CPAP  .Diabetes mellitus - SSI  .Dementia - continue aricept, Will have SW consult to help with placement  Prophylaxis: SCD   CODE STATUS: DNR/DNI   Tahir Blank 01/29/2012, 10:38 PM

## 2012-01-29 NOTE — ED Provider Notes (Signed)
History     CSN: [id number]  Arrival date & time 01/29/12  1416   First MD Initiated Contact with Patient 01/29/12 1458      Chief Complaint  Patient presents with  . Fall    (Consider location/radiation/quality/duration/timing/severity/associated sxs/prior treatment) HPI Comments: Patient presents today from her assisted-living facility for an increase in falls in the last 2 days.  Patient was actually seen here on March 8 after a fall and had abrasions to her arm but otherwise appeared well at that time his discharge.  The daughter to bring her back today because the assisted-living facility is noted that the patient seems to have an increase in blank stares over the last week and increased difficulty with ambulation even with the assistance of her walker.  Family does note that approximately a week and a half ago the patient was started on Wellbutrin 4 an episode 2 weeks ago and with the patient was making comments about "just kill me now".  That medicine was stopped last night because the family noticed a change since it was started and felt that it was negatively affecting the patient.  Patient denies any pain at this time and there has been no history of fevers, nausea, vomiting, chest pain, cough, dysuria, abdominal pain or other complaints.  Patient does have abrasions on her left arm and the family notes that was after the first fall for which the patient was evaluated here on the eighth.  Family brings the patient here because of the continued falls in the patient's inability to ambulate even with her walker on her own.  Patient is a 78 y.o. female presenting with fall. The history is provided by the patient and a relative. History Limited By: Patient is not able to give significant history due to dementia history primarily comes from the daughters. No language interpreter was used.  Fall The accident occurred yesterday. The fall occurred while walking. She landed on carpet. There was no  blood loss. The patient is experiencing no pain. She was ambulatory at the scene. There was no entrapment after the fall. There was no drug use involved in the accident. There was no alcohol use involved in the accident. Pertinent negatives include no fever, no numbness, no abdominal pain, no bowel incontinence, no nausea, no vomiting, no hematuria, no headaches, no hearing loss, no loss of consciousness and no tingling. She has tried nothing for the symptoms.    Past Medical History  Diagnosis Date  . Diabetes mellitus   . Hypertension   . Dementia     Past Surgical History  Procedure Date  . Knee surgery   . Abdominal hysterectomy     History reviewed. No pertinent family history.  History  Substance Use Topics  . Smoking status: Former Games developer  . Smokeless tobacco: Not on file  . Alcohol Use: No    OB History    Grav Para Term Preterm Abortions TAB SAB Ect Mult Living                  Review of Systems  Constitutional: Negative.  Negative for fever and chills.  HENT: Negative.   Eyes: Negative.  Negative for discharge and redness.  Respiratory: Negative.  Negative for cough and shortness of breath.   Cardiovascular: Negative.  Negative for chest pain.  Gastrointestinal: Negative.  Negative for nausea, vomiting, abdominal pain, diarrhea and bowel incontinence.  Genitourinary: Negative.  Negative for dysuria, hematuria and vaginal discharge.  Musculoskeletal: Positive for gait  problem. Negative for back pain.  Skin: Negative.  Negative for color change and rash.  Neurological: Negative.  Negative for tingling, loss of consciousness, syncope, numbness and headaches.  Hematological: Negative.  Negative for adenopathy.  Psychiatric/Behavioral: Positive for confusion.  All other systems reviewed and are negative.    Allergies  Review of patient's allergies indicates no known allergies.  Home Medications   Current Outpatient Rx  Name Route Sig Dispense Refill  .  ASPIRIN PO Oral Take by mouth.      Marland Kitchen CITALOPRAM HYDROBROMIDE PO Oral Take by mouth.      . DONEPEZIL HCL PO Oral Take by mouth.      Marland Kitchen LOSARTAN POTASSIUM PO Oral Take by mouth.      . METFORMIN HCL PO Oral Take by mouth.      Marland Kitchen SIMVASTATIN PO Oral Take by mouth.      Marland Kitchen JANUVIA PO Oral Take by mouth.        There were no vitals taken for this visit.  Physical Exam  Nursing note and vitals reviewed. Constitutional: She appears well-developed and well-nourished.  Non-toxic appearance. She does not have a sickly appearance.  HENT:  Head: Normocephalic and atraumatic.       No signs of scalp hematomas, lacerations, abrasions or other facial or scalp injuries.  Eyes: Conjunctivae, EOM and lids are normal. Pupils are equal, round, and reactive to light. No scleral icterus.  Neck: Trachea normal and normal range of motion. Neck supple.  Cardiovascular: Normal rate, regular rhythm and normal heart sounds.   Pulmonary/Chest: Effort normal and breath sounds normal.  Abdominal: Soft. Normal appearance. There is no tenderness. There is no rebound, no guarding and no CVA tenderness.  Musculoskeletal: Normal range of motion.  Neurological: She is alert. She has normal strength.       Cranial nerves II through XII are intact.  Extraocular movements are intact.  Face is symmetric.  Tongue is midline.  Speech is clear.  Patient is alert but not fully oriented.  Family notes that she is at her baseline mental status.  Normal finger to nose testing bilaterally although the patient performs the movement slowly.  Symmetric strength in her arms and legs.  No pronator drift.  Upon standing the patient notes that she feels dizzy and that things are spinning.  She is unable to attempt ambulation safely even with assistance due to the feeling of things spinning.  Skin: Skin is warm, dry and intact. No rash noted.  Psychiatric: She has a normal mood and affect. Her behavior is normal. Judgment and thought content  normal.    ED Course  Procedures (including critical care time)  Results for orders placed during the hospital encounter of 01/29/12  CBC      Component Value Range   WBC 5.3  4.0 - 10.5 (K/uL)   RBC 3.57 (*) 3.87 - 5.11 (MIL/uL)   Hemoglobin 11.5 (*) 12.0 - 15.0 (g/dL)   HCT 16.1 (*) 09.6 - 46.0 (%)   MCV 94.1  78.0 - 100.0 (fL)   MCH 32.2  26.0 - 34.0 (pg)   MCHC 34.2  30.0 - 36.0 (g/dL)   RDW 04.5  40.9 - 81.1 (%)   Platelets 188  150 - 400 (K/uL)  DIFFERENTIAL      Component Value Range   Neutrophils Relative 66  43 - 77 (%)   Neutro Abs 3.5  1.7 - 7.7 (K/uL)   Lymphocytes Relative 21  12 - 46 (%)  Lymphs Abs 1.1  0.7 - 4.0 (K/uL)   Monocytes Relative 10  3 - 12 (%)   Monocytes Absolute 0.6  0.1 - 1.0 (K/uL)   Eosinophils Relative 2  0 - 5 (%)   Eosinophils Absolute 0.1  0.0 - 0.7 (K/uL)   Basophils Relative 0  0 - 1 (%)   Basophils Absolute 0.0  0.0 - 0.1 (K/uL)  BASIC METABOLIC PANEL      Component Value Range   Sodium 141  135 - 145 (mEq/L)   Potassium 4.0  3.5 - 5.1 (mEq/L)   Chloride 104  96 - 112 (mEq/L)   CO2 26  19 - 32 (mEq/L)   Glucose, Bld 91  70 - 99 (mg/dL)   BUN 23  6 - 23 (mg/dL)   Creatinine, Ser 1.61  0.50 - 1.10 (mg/dL)   Calcium 9.6  8.4 - 09.6 (mg/dL)   GFR calc non Af Amer 47 (*) >90 (mL/min)   GFR calc Af Amer 55 (*) >90 (mL/min)  URINALYSIS, ROUTINE W REFLEX MICROSCOPIC      Component Value Range   Color, Urine YELLOW  YELLOW    APPearance CLEAR  CLEAR    Specific Gravity, Urine 1.015  1.005 - 1.030    pH 7.5  5.0 - 8.0    Glucose, UA NEGATIVE  NEGATIVE (mg/dL)   Hgb urine dipstick NEGATIVE  NEGATIVE    Bilirubin Urine NEGATIVE  NEGATIVE    Ketones, ur NEGATIVE  NEGATIVE (mg/dL)   Protein, ur 30 (*) NEGATIVE (mg/dL)   Urobilinogen, UA 1.0  0.0 - 1.0 (mg/dL)   Nitrite NEGATIVE  NEGATIVE    Leukocytes, UA NEGATIVE  NEGATIVE   TROPONIN I      Component Value Range   Troponin I <0.30  <0.30 (ng/mL)  URINE MICROSCOPIC-ADD ON       Component Value Range   Squamous Epithelial / LPF RARE  RARE    WBC, UA 0-2  <3 (WBC/hpf)   RBC / HPF 0-2  <3 (RBC/hpf)   Bacteria, UA RARE  RARE    Dg Chest 2 View  01/29/2012  *RADIOLOGY REPORT*  Clinical Data: Larey Seat.  Mental status changes.  CHEST - 2 VIEW  Comparison: 11/25/2010  Findings: Heart size is normal.  The mediastinum is unremarkable except for unfolding of the thoracic aorta.  The vascularity is normal.  Lungs are clear.  No effusions.  Ordinary degenerative changes effect the spine.  IMPRESSION: No active disease  Original Report Authenticated By: Thomasenia Sales, M.D.   Ct Head Wo Contrast  01/29/2012  *RADIOLOGY REPORT*  Clinical Data: Recent fall.  Lethargy.  Dementia.  CT HEAD WITHOUT CONTRAST  Technique:  Contiguous axial images were obtained from the base of the skull through the vertex without contrast.  Comparison: 08/12/2011  Findings: The brain shows generalized atrophy.  There is moderate chronic appearing small vessel change within the deep white matter. No cortical or large vessel territory infarction.  No mass lesion, hemorrhage, hydrocephalus or extra-axial collection.  No skull fracture.  Sinuses, middle ears and mastoids are clear.  IMPRESSION: Atrophy and chronic appearing small vessel change of the white matter.  No sign of acute or reversible process.  Original Report Authenticated By: Thomasenia Sales, M.D.   Mr Angiogram Head Wo Contrast  01/29/2012  *RADIOLOGY REPORT*  Clinical Data:  .  Recent fall.  Dizziness and fusion.  Dementia. Hypertension and diabetes.  MRI HEAD WITHOUT CONTRAST MRA HEAD WITHOUT CONTRAST  Technique:  Multiplanar, multiecho pulse sequences of the brain and surrounding structures were obtained without intravenous contrast. Angiographic images of the head were obtained using MRA technique without contrast.  Comparison:  Head CT same date.  Head CT 08/12/2011.  MRI HEAD  Findings:  Diffusion imaging does not show any acute or subacute infarction.  The  brain shows generalized atrophy.  There are mild chronic small vessel changes in the pons.  No cerebellar abnormality.  The cerebral hemispheres show a few old small vessel insults within the thalami and moderate small vessel changes in the hemispheric deep white matter.  No cortical or large vessel territory infarction.  No mass lesion, hemorrhage, hydrocephalus or extra-axial collection.  No pituitary mass.  No inflammatory disease.  No skull or skull base lesion.  IMPRESSION: No acute or reversible findings.  Atrophy and chronic small vessel change.  MRA HEAD  Findings: Both internal carotid arteries are widely patent into the brain.  The anterior middle cerebral vessels are patent without proximal stenosis, aneurysm or vascular malformation.  Both vertebral arteries are patent to the basilar.  No basilar stenosis. Posterior circulation branch vessels are patent.  IMPRESSION: Normal intracranial MR angiography of the large and medium-sized vessels.  Original Report Authenticated By: Thomasenia Sales, M.D.   Mr Brain Wo Contrast  01/29/2012  *RADIOLOGY REPORT*  Clinical Data:  .  Recent fall.  Dizziness and fusion.  Dementia. Hypertension and diabetes.  MRI HEAD WITHOUT CONTRAST MRA HEAD WITHOUT CONTRAST  Technique:  Multiplanar, multiecho pulse sequences of the brain and surrounding structures were obtained without intravenous contrast. Angiographic images of the head were obtained using MRA technique without contrast.  Comparison:  Head CT same date.  Head CT 08/12/2011.  MRI HEAD  Findings:  Diffusion imaging does not show any acute or subacute infarction.  The brain shows generalized atrophy.  There are mild chronic small vessel changes in the pons.  No cerebellar abnormality.  The cerebral hemispheres show a few old small vessel insults within the thalami and moderate small vessel changes in the hemispheric deep white matter.  No cortical or large vessel territory infarction.  No mass lesion, hemorrhage,  hydrocephalus or extra-axial collection.  No pituitary mass.  No inflammatory disease.  No skull or skull base lesion.  IMPRESSION: No acute or reversible findings.  Atrophy and chronic small vessel change.  MRA HEAD  Findings: Both internal carotid arteries are widely patent into the brain.  The anterior middle cerebral vessels are patent without proximal stenosis, aneurysm or vascular malformation.  Both vertebral arteries are patent to the basilar.  No basilar stenosis. Posterior circulation branch vessels are patent.  IMPRESSION: Normal intracranial MR angiography of the large and medium-sized vessels.  Original Report Authenticated By: Thomasenia Sales, M.D.      Date: 01/29/2012  Rate: 59  Rhythm: normal sinus rhythm  QRS Axis: normal  Intervals: normal  ST/T Wave abnormalities: normal  Conduction Disutrbances:none  Narrative Interpretation:   Old EKG Reviewed: unchanged from 12/24/2002 except for one PVC on prior ECG    MDM  Patient with symptoms of multiple falls with possible generalized weakness as a cause but the patient also notes some dizziness.  I attempted to have the patient stands with me in the emergency department and she was unable to even stand with assistance.  She was not able to ambulate with assistance due to their dizziness and weakness.  Given the patient's description that things were spinning and became concerned about  posterior circulation insufficiency in this patient and did obtain an emergent MRI MRA which did not demonstrate any signs of stroke or posterior circulation insufficiency is at this time.  I also considered possible underlying infection as a cause for the patient's change in her abilities to do her daily tasks.  She does not have pneumonia on her chest x-ray or a UTI on her urinalysis.  There no significant glucose or other a left leg abnormalities at this point in time.  I've had extensive discussions with the daughters of this patient about the safety of  her current living situation which is assisted living where she would be expected to perform some of her daily tasks on her own.  As the patient is not able to sufficiently stand or ambulate here in the emergency department this precludes her from going back to the assisted living facility.  Patient also appears to have moderate to severe dementia and is not safe in a unit where she is able to walk around freely as she is already attempted to leave the facility.  Family is in agreement that the patient likely needs a higher level of care and I discussed this with Dr. Kaylyn Layer from the [facility] over a [place] and he accepts her in transfer for admission for further evaluation of this patient in her current living situation.        Nat Christen, MD 01/29/12 2124

## 2012-01-29 NOTE — ED Notes (Signed)
Patient uses bedpan with assistance from myself and Data processing manager. Patient given crackers and peanut butter and water to drink

## 2012-01-30 ENCOUNTER — Other Ambulatory Visit: Payer: Self-pay

## 2012-01-30 LAB — HEMOGLOBIN A1C: Hgb A1c MFr Bld: 5.7 % — ABNORMAL HIGH (ref <5.7)

## 2012-01-30 LAB — GLUCOSE, CAPILLARY
Glucose-Capillary: 129 mg/dL — ABNORMAL HIGH (ref 70–99)
Glucose-Capillary: 94 mg/dL (ref 70–99)

## 2012-01-30 LAB — CBC
HCT: 32.3 % — ABNORMAL LOW (ref 36.0–46.0)
Hemoglobin: 11.7 g/dL — ABNORMAL LOW (ref 12.0–15.0)
MCHC: 33.4 g/dL (ref 30.0–36.0)
MCV: 93.4 fL (ref 78.0–100.0)
Platelets: 163 10*3/uL (ref 150–400)
Platelets: 175 10*3/uL (ref 150–400)
RBC: 3.46 MIL/uL — ABNORMAL LOW (ref 3.87–5.11)
RBC: 3.73 MIL/uL — ABNORMAL LOW (ref 3.87–5.11)
WBC: 5 10*3/uL (ref 4.0–10.5)

## 2012-01-30 LAB — COMPREHENSIVE METABOLIC PANEL
CO2: 26 mEq/L (ref 19–32)
Calcium: 9.1 mg/dL (ref 8.4–10.5)
Chloride: 106 mEq/L (ref 96–112)
Creatinine, Ser: 0.98 mg/dL (ref 0.50–1.10)
GFR calc Af Amer: 63 mL/min — ABNORMAL LOW (ref >90)
GFR calc non Af Amer: 54 mL/min — ABNORMAL LOW (ref >90)
Glucose, Bld: 105 mg/dL — ABNORMAL HIGH (ref 70–99)
Total Bilirubin: 0.4 mg/dL (ref 0.3–1.2)

## 2012-01-30 LAB — CREATININE, SERUM
GFR calc Af Amer: 61 mL/min — ABNORMAL LOW (ref >90)
GFR calc non Af Amer: 53 mL/min — ABNORMAL LOW (ref >90)

## 2012-01-30 MED ORDER — INSULIN ASPART 100 UNIT/ML ~~LOC~~ SOLN: 0.0000 [IU] | Freq: Every day | SUBCUTANEOUS | Status: DC

## 2012-01-30 MED ORDER — INSULIN ASPART 100 UNIT/ML ~~LOC~~ SOLN
0.0000 [IU] | Freq: Three times a day (TID) | SUBCUTANEOUS | Status: DC
  Filled 2012-01-30: qty 3

## 2012-01-30 NOTE — Progress Notes (Signed)
CSW met with Dtr, Dedra Skeens.  Provided information re: SNF placement process and list of rehab/snf in area.  Continue to f/u with PT/OT recommendations. Milus Banister MSW,LCSW w/e Coverage [phone]

## 2012-01-30 NOTE — Progress Notes (Signed)
Assessment complete.  Message left for dtr Gwen. Milus Banister MSW,LCSW w/e Coverage [phone]

## 2012-01-30 NOTE — Progress Notes (Signed)
Physical Therapy Evaluation Patient Details Name: Mackenzie Harmon MRN: [id number] DOB: 1932/07/28 Today's Date: 01/30/2012  Problem List:  Patient Active Problem List  Diagnoses  . HYPERLIPIDEMIA  . DEPRESSION  . HYPERTENSION  . OBSTRUCTIVE SLEEP APNEA  . PLEURAL EFFUSION  . WEIGHT LOSS-ABNORMAL  . Diabetes mellitus  . Dementia    Past Medical History:  Past Medical History  Diagnosis Date  . Diabetes mellitus   . Hypertension   . Dementia    Past Surgical History:  Past Surgical History  Procedure Date  . Knee surgery   . Abdominal hysterectomy     PT Assessment/Plan/Recommendation PT Assessment Clinical Impression Statement: Pt presents with a medical diagnosis of multiple falls with generalized weakness and history of dementia although family expressed cognition improving. Pt had difficulty following instructions as well as maintaining safe mobility. Pt will benefit from skilled PT in the acute care setting in order to maximize functional mobility and safety. PT Recommendation/Assessment: Patient will need skilled PT in the acute care venue PT Problem List: Decreased activity tolerance;Decreased balance;Decreased mobility;Decreased knowledge of use of DME;Decreased safety awareness;Decreased knowledge of precautions PT Therapy Diagnosis : Generalized weakness;Difficulty walking;Abnormality of gait PT Plan PT Frequency: Min 3X/week PT Treatment/Interventions: DME instruction;Gait training;Functional mobility training;Therapeutic activities;Balance training;Neuromuscular re-education;Patient/family education PT Recommendation Follow Up Recommendations: Skilled nursing facility Equipment Recommended: Defer to next venue PT Goals  Acute Rehab PT Goals PT Goal Formulation: With patient/family Time For Goal Achievement: 2 weeks Pt will go Sit to Stand: with supervision PT Goal: Sit to Stand - Progress: Goal set today Pt will go Stand to Sit: with supervision PT Goal:  Stand to Sit - Progress: Goal set today Pt will Transfer Bed to Chair/Chair to Bed: with supervision PT Transfer Goal: Bed to Chair/Chair to Bed - Progress: Goal set today Pt will Ambulate: 51 - 150 feet;with supervision;with least restrictive assistive device PT Goal: Ambulate - Progress: Goal set today  PT Evaluation Precautions/Restrictions  Precautions Precautions: Fall Restrictions Weight Bearing Restrictions: No Prior Functioning  Home Living Lives With: Other (Comment) (ALF) Receives Help From: Personal care attendant Type of Home: Assisted living [facility]) Home Layout: One level Home Access: Level entry Home Adaptive Equipment: Walker - rolling;Raised toilet seat with rails Prior Function Level of Independence: Independent with basic ADLs;Independent with gait;Independent with transfers (falling recently at ALF) Able to Take Stairs?: No Driving: No Vocation: Retired Producer, television/film/video: Awake/alert Overall Cognitive Status: History of cognitive impairments History of Cognitive Impairment: Appears at baseline functioning Attention: Appears overall intact for tasks assessed Memory: Appears impaired Orientation Level: Disoriented to person Following Commands: Follows one step commands inconsistently Safety/Judgement: Decreased awareness of safety precautions Decreased Safety/Judgement: Decreased awareness of need for assistance Awareness of Deficits: Decreased awareness of deficits Sensation/Coordination Sensation Light Touch: Appears Intact Extremity Assessment RLE Assessment RLE Assessment: Not tested (pt refused strength testing) LLE Assessment LLE Assessment: Not tested (pt refused strength testing) Mobility (including Balance) Bed Mobility Bed Mobility: Yes Supine to Sit: 6: Modified independent (Device/Increase time) Sitting - Scoot to Edge of Bed: 6: Modified independent (Device/Increase time) Transfers Transfers: Yes Sit to  Stand: 2: Max assist;With upper extremity assist;From bed;From toilet Sit to Stand Details (indicate cue type and reason): Max assist secondary to stability and balance as pt tends to posteriorly lean. VC throughout transfer for anterior translation and knee bending Stand to Sit: 3: Mod assist;With upper extremity assist;To chair/3-in-1;To toilet Stand to Sit Details: VC for sequencing throughout as well as anterior  translation. Pt required tactile and verbal cues to flex knees into sitting as pt is resitant with tightened knee extension. Ambulation/Gait Ambulation/Gait: Yes Ambulation/Gait Assistance: 2: Max assist Ambulation/Gait Assistance Details (indicate cue type and reason): Max assist for stability secondary to posterior lean. VC for safety with RW. PT needed to navigate RW. Ambulation Distance (Feet): 20 Feet (to/from bathroom) Assistive device: Rolling walker Gait Pattern: Step-to pattern;Decreased stride length;Decreased hip/knee flexion - right;Decreased hip/knee flexion - left Gait velocity: SLOW cadence Stairs: No    Exercise    End of Session PT - End of Session Equipment Utilized During Treatment: Gait belt Activity Tolerance: Treatment limited secondary to agitation Patient left: in chair;with call bell in reach;with family/visitor present Nurse Communication: Mobility status for transfers;Mobility status for ambulation General Behavior During Session: Agitated Cognition: Impaired, at baseline  Milana Kidney 01/30/2012, 12:53 PM  01/30/2012 Milana Kidney DPT PAGER: [phone] OFFICE: [phone]

## 2012-01-30 NOTE — Progress Notes (Signed)
Subjective: Patient seen and examined, pleasantly confused and denies any complaints.  Objective: Vital signs in last 24 hours: Temp:  [98.2 F (36.8 C)-98.5 F (36.9 C)] 98.5 F (36.9 C) (03/10 0556) Pulse Rate:  [56-71] 56  (03/10 0556) Resp:  [16-20] 19  (03/10 0556) BP: (166-180)/(65-78) 170/77 mmHg (03/10 0556) SpO2:  [96 %-98 %] 98 % (03/10 0556) Weight:  [70 kg (154 lb 5.2 oz)] 70 kg (154 lb 5.2 oz) (03/09 2220) Weight change:     Intake/Output from previous day:       Physical Exam: General: Alert, awake, oriented x0, in no acute distress. Confused Heart: Regular rate and rhythm, without murmurs, rubs, gallops. Lungs: Clear to auscultation bilaterally. Abdomen: Soft, nontender, nondistended, positive bowel sounds. Extremities: No clubbing cyanosis or edema with positive pedal pulses. Neuro: Grossly intact, nonfocal.    Lab Results: Results for orders placed during the hospital encounter of 01/29/12 (from the past 24 hour(s))  CBC     Status: Abnormal   Collection Time   01/29/12  5:59 PM      Component Value Range   WBC 5.3  4.0 - 10.5 (K/uL)   RBC 3.57 (*) 3.87 - 5.11 (MIL/uL)   Hemoglobin 11.5 (*) 12.0 - 15.0 (g/dL)   HCT 04.5 (*) 40.9 - 46.0 (%)   MCV 94.1  78.0 - 100.0 (fL)   MCH 32.2  26.0 - 34.0 (pg)   MCHC 34.2  30.0 - 36.0 (g/dL)   RDW 81.1  91.4 - 78.2 (%)   Platelets 188  150 - 400 (K/uL)  DIFFERENTIAL     Status: Normal   Collection Time   01/29/12  5:59 PM      Component Value Range   Neutrophils Relative 66  43 - 77 (%)   Neutro Abs 3.5  1.7 - 7.7 (K/uL)   Lymphocytes Relative 21  12 - 46 (%)   Lymphs Abs 1.1  0.7 - 4.0 (K/uL)   Monocytes Relative 10  3 - 12 (%)   Monocytes Absolute 0.6  0.1 - 1.0 (K/uL)   Eosinophils Relative 2  0 - 5 (%)   Eosinophils Absolute 0.1  0.0 - 0.7 (K/uL)   Basophils Relative 0  0 - 1 (%)   Basophils Absolute 0.0  0.0 - 0.1 (K/uL)  BASIC METABOLIC PANEL     Status: Abnormal   Collection Time   01/29/12  5:59 PM       Component Value Range   Sodium 141  135 - 145 (mEq/L)   Potassium 4.0  3.5 - 5.1 (mEq/L)   Chloride 104  96 - 112 (mEq/L)   CO2 26  19 - 32 (mEq/L)   Glucose, Bld 91  70 - 99 (mg/dL)   BUN 23  6 - 23 (mg/dL)   Creatinine, Ser 9.56  0.50 - 1.10 (mg/dL)   Calcium 9.6  8.4 - 21.3 (mg/dL)   GFR calc non Af Amer 47 (*) >90 (mL/min)   GFR calc Af Amer 55 (*) >90 (mL/min)  TROPONIN I     Status: Normal   Collection Time   01/29/12  5:59 PM      Component Value Range   Troponin I <0.30  <0.30 (ng/mL)  URINALYSIS, ROUTINE W REFLEX MICROSCOPIC     Status: Abnormal   Collection Time   01/29/12  6:51 PM      Component Value Range   Color, Urine YELLOW  YELLOW    APPearance CLEAR  CLEAR  Specific Gravity, Urine 1.015  1.005 - 1.030    pH 7.5  5.0 - 8.0    Glucose, UA NEGATIVE  NEGATIVE (mg/dL)   Hgb urine dipstick NEGATIVE  NEGATIVE    Bilirubin Urine NEGATIVE  NEGATIVE    Ketones, ur NEGATIVE  NEGATIVE (mg/dL)   Protein, ur 30 (*) NEGATIVE (mg/dL)   Urobilinogen, UA 1.0  0.0 - 1.0 (mg/dL)   Nitrite NEGATIVE  NEGATIVE    Leukocytes, UA NEGATIVE  NEGATIVE   URINE MICROSCOPIC-ADD ON     Status: Normal   Collection Time   01/29/12  6:51 PM      Component Value Range   Squamous Epithelial / LPF RARE  RARE    WBC, UA 0-2  <3 (WBC/hpf)   RBC / HPF 0-2  <3 (RBC/hpf)   Bacteria, UA RARE  RARE   CBC     Status: Abnormal   Collection Time   01/30/12  1:06 AM      Component Value Range   WBC 5.0  4.0 - 10.5 (K/uL)   RBC 3.46 (*) 3.87 - 5.11 (MIL/uL)   Hemoglobin 10.9 (*) 12.0 - 15.0 (g/dL)   HCT 21.3 (*) 08.6 - 46.0 (%)   MCV 93.4  78.0 - 100.0 (fL)   MCH 31.5  26.0 - 34.0 (pg)   MCHC 33.7  30.0 - 36.0 (g/dL)   RDW 57.8  46.9 - 62.9 (%)   Platelets 163  150 - 400 (K/uL)  CREATININE, SERUM     Status: Abnormal   Collection Time   01/30/12  1:06 AM      Component Value Range   Creatinine, Ser 1.00  0.50 - 1.10 (mg/dL)   GFR calc non Af Amer 53 (*) >90 (mL/min)   GFR calc Af Amer 61  (*) >90 (mL/min)  GLUCOSE, CAPILLARY     Status: Abnormal   Collection Time   01/30/12  6:45 AM      Component Value Range   Glucose-Capillary 106 (*) 70 - 99 (mg/dL)   Comment 1 Documented in Chart     Comment 2 Notify RN    MAGNESIUM     Status: Normal   Collection Time   01/30/12  7:04 AM      Component Value Range   Magnesium 1.5  1.5 - 2.5 (mg/dL)  PHOSPHORUS     Status: Normal   Collection Time   01/30/12  7:04 AM      Component Value Range   Phosphorus 3.7  2.3 - 4.6 (mg/dL)  COMPREHENSIVE METABOLIC PANEL     Status: Abnormal   Collection Time   01/30/12  7:04 AM      Component Value Range   Sodium 142  135 - 145 (mEq/L)   Potassium 3.6  3.5 - 5.1 (mEq/L)   Chloride 106  96 - 112 (mEq/L)   CO2 26  19 - 32 (mEq/L)   Glucose, Bld 105 (*) 70 - 99 (mg/dL)   BUN 17  6 - 23 (mg/dL)   Creatinine, Ser 5.28  0.50 - 1.10 (mg/dL)   Calcium 9.1  8.4 - 41.3 (mg/dL)   Total Protein 5.8 (*) 6.0 - 8.3 (g/dL)   Albumin 3.3 (*) 3.5 - 5.2 (g/dL)   AST 21  0 - 37 (U/L)   ALT 15  0 - 35 (U/L)   Alkaline Phosphatase 46  39 - 117 (U/L)   Total Bilirubin 0.4  0.3 - 1.2 (mg/dL)   GFR calc non  Af Amer 54 (*) >90 (mL/min)   GFR calc Af Amer 63 (*) >90 (mL/min)  CBC     Status: Abnormal   Collection Time   01/30/12  7:04 AM      Component Value Range   WBC 5.4  4.0 - 10.5 (K/uL)   RBC 3.73 (*) 3.87 - 5.11 (MIL/uL)   Hemoglobin 11.7 (*) 12.0 - 15.0 (g/dL)   HCT 40.9 (*) 81.1 - 46.0 (%)   MCV 93.8  78.0 - 100.0 (fL)   MCH 31.4  26.0 - 34.0 (pg)   MCHC 33.4  30.0 - 36.0 (g/dL)   RDW 91.4  78.2 - 95.6 (%)   Platelets 175  150 - 400 (K/uL)    Studies/Results: Dg Chest 2 View  01/29/2012  *RADIOLOGY REPORT*  Clinical Data: Larey Seat.  Mental status changes.  CHEST - 2 VIEW  Comparison: 11/25/2010  Findings: Heart size is normal.  The mediastinum is unremarkable except for unfolding of the thoracic aorta.  The vascularity is normal.  Lungs are clear.  No effusions.  Ordinary degenerative changes  effect the spine.  IMPRESSION: No active disease  Original Report Authenticated By: Thomasenia Sales, M.D.   Ct Head Wo Contrast  01/29/2012  *RADIOLOGY REPORT*  Clinical Data: Recent fall.  Lethargy.  Dementia.  CT HEAD WITHOUT CONTRAST  Technique:  Contiguous axial images were obtained from the base of the skull through the vertex without contrast.  Comparison: 08/12/2011  Findings: The brain shows generalized atrophy.  There is moderate chronic appearing small vessel change within the deep white matter. No cortical or large vessel territory infarction.  No mass lesion, hemorrhage, hydrocephalus or extra-axial collection.  No skull fracture.  Sinuses, middle ears and mastoids are clear.  IMPRESSION: Atrophy and chronic appearing small vessel change of the white matter.  No sign of acute or reversible process.  Original Report Authenticated By: Thomasenia Sales, M.D.   Mr Angiogram Head Wo Contrast  01/29/2012  *RADIOLOGY REPORT*  Clinical Data:  .  Recent fall.  Dizziness and fusion.  Dementia. Hypertension and diabetes.  MRI HEAD WITHOUT CONTRAST MRA HEAD WITHOUT CONTRAST  Technique:  Multiplanar, multiecho pulse sequences of the brain and surrounding structures were obtained without intravenous contrast. Angiographic images of the head were obtained using MRA technique without contrast.  Comparison:  Head CT same date.  Head CT 08/12/2011.  MRI HEAD  Findings:  Diffusion imaging does not show any acute or subacute infarction.  The brain shows generalized atrophy.  There are mild chronic small vessel changes in the pons.  No cerebellar abnormality.  The cerebral hemispheres show a few old small vessel insults within the thalami and moderate small vessel changes in the hemispheric deep white matter.  No cortical or large vessel territory infarction.  No mass lesion, hemorrhage, hydrocephalus or extra-axial collection.  No pituitary mass.  No inflammatory disease.  No skull or skull base lesion.  IMPRESSION: No  acute or reversible findings.  Atrophy and chronic small vessel change.  MRA HEAD  Findings: Both internal carotid arteries are widely patent into the brain.  The anterior middle cerebral vessels are patent without proximal stenosis, aneurysm or vascular malformation.  Both vertebral arteries are patent to the basilar.  No basilar stenosis. Posterior circulation branch vessels are patent.  IMPRESSION: Normal intracranial MR angiography of the large and medium-sized vessels.  Original Report Authenticated By: Thomasenia Sales, M.D.   Mr Brain Wo Contrast  01/29/2012  *RADIOLOGY REPORT*  Clinical Data:  .  Recent fall.  Dizziness and fusion.  Dementia. Hypertension and diabetes.  MRI HEAD WITHOUT CONTRAST MRA HEAD WITHOUT CONTRAST  Technique:  Multiplanar, multiecho pulse sequences of the brain and surrounding structures were obtained without intravenous contrast. Angiographic images of the head were obtained using MRA technique without contrast.  Comparison:  Head CT same date.  Head CT 08/12/2011.  MRI HEAD  Findings:  Diffusion imaging does not show any acute or subacute infarction.  The brain shows generalized atrophy.  There are mild chronic small vessel changes in the pons.  No cerebellar abnormality.  The cerebral hemispheres show a few old small vessel insults within the thalami and moderate small vessel changes in the hemispheric deep white matter.  No cortical or large vessel territory infarction.  No mass lesion, hemorrhage, hydrocephalus or extra-axial collection.  No pituitary mass.  No inflammatory disease.  No skull or skull base lesion.  IMPRESSION: No acute or reversible findings.  Atrophy and chronic small vessel change.  MRA HEAD  Findings: Both internal carotid arteries are widely patent into the brain.  The anterior middle cerebral vessels are patent without proximal stenosis, aneurysm or vascular malformation.  Both vertebral arteries are patent to the basilar.  No basilar stenosis. Posterior  circulation branch vessels are patent.  IMPRESSION: Normal intracranial MR angiography of the large and medium-sized vessels.  Original Report Authenticated By: Thomasenia Sales, M.D.    Medications:    . aspirin  81 mg Oral Daily  . citalopram  20 mg Oral Daily  . docusate sodium  100 mg Oral BID  . donepezil  10 mg Oral QHS  . enoxaparin  40 mg Subcutaneous Q24H  . insulin aspart  0-15 Units Subcutaneous TID WC  . insulin aspart  0-5 Units Subcutaneous QHS  . losartan  50 mg Oral BID  . metFORMIN  1,000 mg Oral BID AC  . simvastatin  40 mg Oral QPM  . DISCONTD: aspirin  81 mg Oral Daily  . DISCONTD: docusate sodium  100 mg Oral 2 times weekly    acetaminophen, acetaminophen, albuterol, ALPRAZolam, alum & mag hydroxide-simeth, guaiFENesin-dextromethorphan, HYDROcodone-acetaminophen, ondansetron (ZOFRAN) IV, ondansetron, polyethylene glycol, DISCONTD: polyethylene glycol powder     . sodium chloride 75 mL/hr at 01/30/12 0034    Assessment/Plan: Generalized weakness: MRI brain is negative, PT/OT consult pending. Dementia with confusion: Continue Aranesp, workup is negative so far and family requesting no further workup. HYPERTENSION - uncontrolled continue cozaar, check vitals for orthostasis. We'll  .OBSTRUCTIVE SLEEP APNEA - continue with CPAP  .Diabetes mellitus - controlled, continue metformin and SSI  Disposition: Pending PT eval, likely will need SNF  Daughter was updated at bedside.       LOS: 1 day   Felishia Wartman 01/30/2012, 8:14 AM

## 2012-01-30 NOTE — Progress Notes (Signed)
Pt and family states that she has had sleep study done in the past and does not wear cpap at home.  Does not wish to wear one here, pt on room air no distress noted.

## 2012-01-31 LAB — GLUCOSE, CAPILLARY: Glucose-Capillary: 109 mg/dL — ABNORMAL HIGH (ref 70–99)

## 2012-01-31 MED ORDER — HYDRALAZINE HCL 20 MG/ML IJ SOLN
10.0000 mg | Freq: Four times a day (QID) | INTRAMUSCULAR | Status: DC | PRN
  Administered 2012-01-31: 10 mg via INTRAVENOUS
  Filled 2012-01-31: qty 0.5

## 2012-01-31 MED ORDER — AMLODIPINE BESYLATE 5 MG PO TABS
5.0000 mg | ORAL_TABLET | Freq: Every day | ORAL | Status: DC
  Administered 2012-01-31 – 2012-02-02 (×3): 5 mg via ORAL
  Filled 2012-01-31 (×3): qty 1

## 2012-01-31 MED ORDER — ATORVASTATIN CALCIUM 20 MG PO TABS
20.0000 mg | ORAL_TABLET | Freq: Every day | ORAL | Status: DC
  Administered 2012-01-31 – 2012-02-01 (×2): 20 mg via ORAL
  Filled 2012-01-31 (×3): qty 1

## 2012-01-31 NOTE — Progress Notes (Signed)
CSW met with pt dtr regarding snf placement and provided pt family with list of facilities in [place]  .Clinical social worker initiated skilled nursing facility search, see placement note in patient shadow chart.   Pt dtr shared with csw that pt is unable to return to assisted living at [facility] due to pt increased needs.   .Clinical social worker continuing to follow pt to assist with pt dc plans and further csw needs.   Catha Gosselin, Theresia Majors  [phone] .01/31/2012 16:41pm

## 2012-01-31 NOTE — Progress Notes (Signed)
Subjective: Patient seen and examined, pleasantly confused and denies any complaints.  Objective: Vital signs in last 24 hours: Temp:  [97.3 F (36.3 C)-98.6 F (37 C)] 98.4 F (36.9 C) (03/11 0900) Pulse Rate:  [59-69] 61  (03/11 0900) Resp:  [16-20] 18  (03/11 0900) BP: (154-191)/(66-92) 181/85 mmHg (03/11 0900) SpO2:  [97 %-99 %] 97 % (03/11 0900) Weight change:  Last BM Date: 01/27/12  Intake/Output from previous day:       Physical Exam: General: Alert, awake, oriented x0, in no acute distress. Confused  Heart: Regular rate and rhythm, without murmurs, rubs, gallops.  Lungs: Clear to auscultation bilaterally.  Abdomen: Soft, nontender, nondistended, positive bowel sounds.  Extremities: No clubbing cyanosis or edema with positive pedal pulses.  Neuro: Grossly intact, nonfocal.     Lab Results: Results for orders placed during the hospital encounter of 01/29/12 (from the past 24 hour(s))  GLUCOSE, CAPILLARY     Status: Normal   Collection Time   01/30/12  4:42 PM      Component Value Range   Glucose-Capillary 94  70 - 99 (mg/dL)   Comment 1 Notify RN     Comment 2 Documented in Chart    GLUCOSE, CAPILLARY     Status: Normal   Collection Time   01/30/12  9:22 PM      Component Value Range   Glucose-Capillary 93  70 - 99 (mg/dL)  GLUCOSE, CAPILLARY     Status: Abnormal   Collection Time   01/31/12  7:05 AM      Component Value Range   Glucose-Capillary 109 (*) 70 - 99 (mg/dL)  GLUCOSE, CAPILLARY     Status: Abnormal   Collection Time   01/31/12 11:43 AM      Component Value Range   Glucose-Capillary 116 (*) 70 - 99 (mg/dL)    Studies/Results:  Medications:    . aspirin  81 mg Oral Daily  . citalopram  20 mg Oral Daily  . docusate sodium  100 mg Oral BID  . donepezil  10 mg Oral QHS  . enoxaparin  40 mg Subcutaneous Q24H  . insulin aspart  0-15 Units Subcutaneous TID WC  . insulin aspart  0-5 Units Subcutaneous QHS  . losartan  50 mg Oral BID  .  metFORMIN  1,000 mg Oral BID AC  . simvastatin  40 mg Oral QPM    acetaminophen, acetaminophen, albuterol, ALPRAZolam, alum & mag hydroxide-simeth, guaiFENesin-dextromethorphan, hydrALAZINE, HYDROcodone-acetaminophen, ondansetron (ZOFRAN) IV, ondansetron, polyethylene glycol     Assessment/Plan: Generalized weakness: MRI brain is negative, continue PT/OT  Dementia with confusion: Continue Aranesp, workup is negative so far . family initially requested  no further TIA workup. However I spoke to daughter today and she requested to complete the workup including 2-D echocardiogram and carotid Dopplers. I had contacted hold daughter who is a health care power of attorney Oneita Kras [address] [phone] goals of care however her number was busy we will attempt at another time. 2-D echocardiogram and carotid duplex ordered but. HYPERTENSION - uncontrolled continue cozaar, and amlodipine and hydralazine when necessary. check vitals for orthostasis.  .OBSTRUCTIVE SLEEP APNEA - continue with CPAP  .Diabetes mellitus - controlled, continue metformin and SSI  Disposition: Was evaluated by PT and SNF was recommended. Core status DO NOT RESUSCITATE Family communication: Daughter was updated at bedside. Attempted to contact health care power of attorney as above.       LOS: 2 days   Charles Niese 01/31/2012, 12:32 PM

## 2012-01-31 NOTE — Progress Notes (Signed)
  Echocardiogram 2D Echocardiogram has been performed.  Mell Mellott L 01/31/2012, 3:23 PM

## 2012-01-31 NOTE — Progress Notes (Signed)
PT has refused CPAP again tonight. PT states she is very tired and does not want to try CPAP at this time. Maybe she will try it tomorrow night. Rt will continue to monitor.

## 2012-01-31 NOTE — Evaluation (Signed)
Occupational Therapy Evaluation Patient Details Name: Mackenzie Harmon MRN: [id number] DOB: Aug 30, 1932 Today's Date: 01/31/2012  Problem List:  Patient Active Problem List  Diagnoses  . HYPERLIPIDEMIA  . DEPRESSION  . HYPERTENSION  . OBSTRUCTIVE SLEEP APNEA  . PLEURAL EFFUSION  . WEIGHT LOSS-ABNORMAL  . Diabetes mellitus  . Dementia    Past Medical History:  Past Medical History  Diagnosis Date  . Diabetes mellitus   . Hypertension   . Dementia    Past Surgical History:  Past Surgical History  Procedure Date  . Knee surgery   . Abdominal hysterectomy     OT Assessment/Plan/Recommendation OT Assessment Clinical Impression Statement: This 78 y.o. female with h/o dementia admitted for recent falls and decline in mental status/functioning.  Pt. presents to OT with the below listed deficits.  Currently, pt. requires max A for all ADLs, and requires step by step simple instructions due to significant motor planning deficits.  Pt. becomes agitated as she fatigues.  Recommend SNF level rehab at discharge OT Recommendation/Assessment: Patient will need skilled OT in the acute care venue OT Problem List: Decreased strength;Decreased activity tolerance;Impaired balance (sitting and/or standing);Decreased cognition;Decreased safety awareness Barriers to Discharge: None OT Therapy Diagnosis : Generalized weakness;Cognitive deficits;Apraxia OT Plan OT Frequency: Min 2X/week OT Treatment/Interventions: Self-care/ADL training;DME and/or AE instruction;Therapeutic activities;Cognitive remediation/compensation;Patient/family education;Balance training OT Recommendation Follow Up Recommendations: Skilled nursing facility;Supervision/Assistance - 24 hour Equipment Recommended: Defer to next venue Individuals Consulted Consulted and Agree with Results and Recommendations: Patient OT Goals Acute Rehab OT Goals OT Goal Formulation: With family Time For Goal Achievement: 2 weeks ADL Goals Pt  Will Perform Eating: with min assist;Supported;Sitting, chair ADL Goal: Eating - Progress: Goal set today Pt Will Perform Grooming: Sitting, chair;Supported;with min assist ADL Goal: Grooming - Progress: Goal set today Pt Will Perform Upper Body Bathing: with mod assist;Sitting, chair ADL Goal: Upper Body Bathing - Progress: Goal set today Pt Will Transfer to Toilet: with mod assist;Ambulation;3-in-1;Comfort height toilet ADL Goal: Toilet Transfer - Progress: Goal set today  OT Evaluation Precautions/Restrictions  Precautions Precautions: Fall Restrictions Weight Bearing Restrictions: No Prior Functioning Home Living Lives With:  (ALF) Receives Help From: Personal care attendant Type of Home: Assisted living Additional Comments: Pt. lived at home with 24 hour caregivers until mid Feb., then moved into ALF.  Family reports pt. with significant decline in function since the transition, but that pt. had been declining in her home Prior Function Level of Independence: Needs assistance with ADLs;Needs assistance with homemaking;Needs assistance with gait;Needs assistance with tranfers;Requires assistive device for independence (Recent falls at ALF) Bath: Maximal Toileting: Maximal Dressing: Maximal Grooming: Minimal Feeding: Minimal Able to Take Stairs?: No Driving: No Vocation: Retired ADL ADL Eating/Feeding: Performed;Moderate assistance Where Assessed - Eating/Feeding: Bed level Grooming: Performed;Brushing hair;Supervision/safety (min verbal cues) Grooming Details (indicate cue type and reason): Family reports status is variable Where Assessed - Grooming: Unsupported;Sitting, bed Upper Body Bathing: Simulated;Maximal assistance Where Assessed - Upper Body Bathing: Unsupported;Sitting, bed Lower Body Bathing: Simulated;Maximal assistance Where Assessed - Lower Body Bathing: Sit to stand from bed Upper Body Dressing: Simulated;+1 Total assistance Where Assessed - Upper Body  Dressing: Unsupported;Sitting, bed Lower Body Dressing: Simulated;Performed;Maximal assistance (socks) Where Assessed - Lower Body Dressing: Sit to stand from bed Toilet Transfer: Simulated;Maximal assistance Toilet Transfer Method: Stand pivot Toilet Transfer Equipment: Bedside commode Toileting - Clothing Manipulation: Simulated;Maximal assistance Where Assessed - Glass blower/designer Manipulation: Standing Toileting - Hygiene: Simulated;+1 Total assistance Where Assessed - Toileting Hygiene: Standing ADL Comments: Pt.  doffed socks with min verbal cues, but required mod A and max verbal and tactile cues to don them.  Pt. attempted to place socks on hands, and required task to be broken down into one step movement/actions in order to complete it.  Required therapist's assistance to get socks started on feet, as pt. unable to initiate, nor plan the action.  Pt. extremely slow to respond/process, and requires simple one step commands.  Pt at times joking with therapist.   Pt. noted to have posterior bias initially when sitting EOB, but corrected with min A.  Pt. stood with mod A, progressing to standing with min-mod A (variable).  Pt. became fatigued, then demonstrated complete inability to plan motor movements, or follow commands.  Pt. became irritable, and would not allow therapist to assist (family out of room during this time).  After family returned, and pt. rested, affect brightened, and pt. less irritable, and agreeable to assistance Vision/Perception  Vision - History Baseline Vision: Wears glasses all the time Praxis Praxis: Impaired Praxis Impairment Details: Motor planning;Initiation;Ideation;Ideomotor (function varies within session, and with fatigue) Cognition Cognition Arousal/Alertness: Awake/alert Overall Cognitive Status: History of cognitive impairments History of Cognitive Impairment: Appears at baseline functioning Attention: Impaired Current Attention Level:  Sustained Memory: Appears impaired Following Commands: Follows one step commands consistently Cognition - Other Comments: Pt. with history dementia Sensation/Coordination Coordination Gross Motor Movements are Fluid and Coordinated: Yes Fine Motor Movements are Fluid and Coordinated: Yes Extremity Assessment RUE Assessment RUE Assessment: Within Functional Limits LUE Assessment LUE Assessment: Within Functional Limits Mobility  Bed Mobility Bed Mobility: Yes Rolling Right: 4: Min assist;With rail Right Sidelying to Sit: 2: Max assist (step by step cues, and increased time) Sitting - Scoot to Edge of Bed: 3: Mod assist Transfers Transfers: Yes Sit to Stand: 3: Mod assist;With upper extremity assist Sit to Stand Details (indicate cue type and reason): Facilitation at hips for extension and rib cage, and facilitation for adequate hip flexion with trunk extended to move sit to stand. Exercises   End of Session OT - End of Session Activity Tolerance: Patient tolerated treatment well Patient left: in bed;with call bell in reach;with family/visitor present General Behavior During Session: Agitated Cognition: Impaired, at baseline   Iris Tatsch M 01/31/2012, 6:25 PM

## 2012-01-31 NOTE — Progress Notes (Signed)
Utilization Review Completed.Danett Palazzo T3/09/2012   

## 2012-02-01 LAB — GLUCOSE, CAPILLARY
Glucose-Capillary: 98 mg/dL (ref 70–99)
Glucose-Capillary: 87 mg/dL (ref 70–99)

## 2012-02-01 NOTE — Progress Notes (Signed)
Physical Therapy Treatment Patient Details Name: Mackenzie Harmon MRN: [id number] DOB: [birth year] Today's Date: 02/01/2012  PT Assessment/Plan  PT - Assessment/Plan Comments on Treatment Session: Pt presenting with symptoms indicative of BPPV, however difficult to determine which ear/canal is involved due to decr cognition, and decr neck ROM.  Will benefit from continued PT to address BPPV (? treat Rt ear on next visit) and balance/mobility deficits. PT Plan: Discharge plan remains appropriate;Frequency remains appropriate PT Frequency: Min 3X/week Follow Up Recommendations: Skilled nursing facility Equipment Recommended: Defer to next venue PT Goals  Acute Rehab PT Goals PT Goal: Sit to Stand - Progress: Progressing toward goal PT Goal: Stand to Sit - Progress: Progressing toward goal PT Transfer Goal: Bed to Chair/Chair to Bed - Progress: Progressing toward goal PT Goal: Ambulate - Progress: Progressing toward goal  PT Treatment Precautions/Restrictions  Precautions Precautions: Fall Restrictions Weight Bearing Restrictions: No Mobility (including Balance) Bed Mobility Rolling Right: 3: Mod assist Rolling Right Details (indicate cue type and reason): during BPPV repositioning Rolling Left: 5: Supervision Rolling Left Details (indicate cue type and reason): cue to reach for rail Right Sidelying to Sit: 1: +2 Total assist;HOB flat;With rails;Patient percentage (comment) Right Sidelying to Sit Details (indicate cue type and reason): pt= 25%; during BPPV repositioning Left Sidelying to Sit: 4: Min assist;With rails;HOB elevated (comment degrees) Left Sidelying to Sit Details (indicate cue type and reason): HOB 30 degrees; step by step cues for sequencing Sitting - Scoot to Edge of Bed: 5: Supervision Sit to Sidelying Right: 1: +2 Total assist;Patient percentage (comment);HOB elevated (comment degrees) Sit to Sidelying Right Details (indicate cue type and reason): after repositioning  with HOB 40; pt agitated and unable to cooperate Transfers Sit to Stand: 3: Mod assist;With upper extremity assist;With armrests;From bed;From chair/3-in-1 Sit to Stand Details (indicate cue type and reason): x 3 trials; pt tends to lean posteriorly  Stand to Sit: 3: Mod assist;With upper extremity assist;With armrests;To bed;To chair/3-in-1 Stand to Sit Details: needs physical cues to flex at hips and bring shoulders anteriorly as she descends Ambulation/Gait Ambulation/Gait Assistance: 4: Min assist Ambulation/Gait Assistance Details (indicate cue type and reason): assist to move RW; directional cues to maneuver around bed Ambulation Distance (Feet): 12 Feet Assistive device: Rolling walker Gait Pattern: Decreased stride length;Trunk flexed  Posture/Postural Control Posture/Postural Control: Postural limitations Postural Limitations: flexed trunk; limited neck ROM (~30 degrees rotation Rt and Lt) Balance Balance Assessed: Yes Static Sitting Balance Static Sitting - Balance Support: No upper extremity supported;Feet supported Static Sitting - Level of Assistance: 5: Stand by assistance Static Sitting - Comment/# of Minutes: both EOB and edge of chair Static Standing Balance Static Standing - Balance Support: Bilateral upper extremity supported Static Standing - Level of Assistance: 3: Mod assist Static Standing - Comment/# of Minutes: leans posteriorly High Level Balance High Level Balance Comments: pt reported spinning sensation with sit to stand; son reports she had not had this report prior to recent fall; performed [employer] with bed in trendelenberg to Rt and Lt with no nystagmus noted, but + reports of spinning; difficult to determine if Lt or Rt ear involved; treated Lt ear with Epley maneuver; pt becoming agitated at end of Epley maneurver and therefore deferred treating Rt ear.  Handout and education provided to son. Exercise    End of Session PT - End of Session Equipment  Utilized During Treatment: Gait belt Activity Tolerance: Treatment limited secondary to medical complications (Comment) (limited by dizziness and agitation) Patient left: in  bed;with family/visitor present Nurse Communication: Mobility status for ambulation;Mobility status for transfers General Behavior During Session: Agitated Cognition: Impaired, at baseline  Klohe Lovering 02/01/2012, 11:16 AM Pager [phone]

## 2012-02-01 NOTE — Progress Notes (Signed)
Bilateral carotid artery duplex completed.  Preliminary report is no evidence of significant ICA stenosis. 

## 2012-02-01 NOTE — Progress Notes (Addendum)
Subjective: Patient seen and examined, feeling exhausted after work working with physical therapy.  Objective: Vital signs in last 24 hours: Temp:  [97.5 F (36.4 C)-98.7 F (37.1 C)] 97.7 F (36.5 C) (03/12 0535) Pulse Rate:  [66-73] 66  (03/12 0535) Resp:  [16-17] 16  (03/12 0535) BP: (127-168)/(63-84) 148/63 mmHg (03/12 0535) SpO2:  [97 %-98 %] 98 % (03/12 0535) Weight change:  Last BM Date: 01/27/12  Intake/Output from previous day: 03/11 0701 - 03/12 0700 In: 240 [P.O.:240] Out: -      Physical Exam: General: Alert, awake, oriented x0, in no acute distress. Confused  Heart: Regular rate and rhythm, without murmurs, rubs, gallops.  Lungs: Clear to auscultation bilaterally.  Abdomen: Soft, nontender, nondistended, positive bowel sounds.  Extremities: No clubbing cyanosis or edema with positive pedal pulses.  Neuro: Grossly intact, nonfocal.    Lab Results: Results for orders placed during the hospital encounter of 01/29/12 (from the past 24 hour(s))  GLUCOSE, CAPILLARY     Status: Abnormal   Collection Time   01/31/12 11:43 AM      Component Value Range   Glucose-Capillary 116 (*) 70 - 99 (mg/dL)  GLUCOSE, CAPILLARY     Status: Normal   Collection Time   01/31/12  4:48 PM      Component Value Range   Glucose-Capillary 92  70 - 99 (mg/dL)  GLUCOSE, CAPILLARY     Status: Abnormal   Collection Time   01/31/12  9:53 PM      Component Value Range   Glucose-Capillary 142 (*) 70 - 99 (mg/dL)  GLUCOSE, CAPILLARY     Status: Normal   Collection Time   02/01/12  7:28 AM      Component Value Range   Glucose-Capillary 98  70 - 99 (mg/dL)    Studies/Results: No results found.  Medications:    . amLODipine  5 mg Oral Daily  . aspirin  81 mg Oral Daily  . atorvastatin  20 mg Oral q1800  . citalopram  20 mg Oral Daily  . docusate sodium  100 mg Oral BID  . donepezil  10 mg Oral QHS  . enoxaparin  40 mg Subcutaneous Q24H  . insulin aspart  0-15 Units Subcutaneous  TID WC  . insulin aspart  0-5 Units Subcutaneous QHS  . losartan  50 mg Oral BID  . metFORMIN  1,000 mg Oral BID AC  . DISCONTD: simvastatin  40 mg Oral QPM    acetaminophen, acetaminophen, albuterol, ALPRAZolam, alum & mag hydroxide-simeth, guaiFENesin-dextromethorphan, hydrALAZINE, HYDROcodone-acetaminophen, ondansetron (ZOFRAN) IV, ondansetron, polyethylene glycol     Assessment/Plan: Generalized weakness/Dementia with confusion: - MRI brain is negative,questionable TIA on admission workup is negative so far .  2-D echocardiogram was unremarkable ,carotid Dopplers pending. Continue Aricept ,PT/OT  HYPERTENSION - improved but remained uncontrolled continue cozaar, amlodipine was added yesterday  and hydralazine when necessary.  Vitals negative  for orthostasis. Continue to monitor and adjust medication as needed to  .OBSTRUCTIVE SLEEP APNEA - continue with CPAP  .Diabetes mellitus - controlled, continue metformin and SSI  Core status DO NOT RESUSCITATE  Family communication:  health care power of attorney Oneita Kras [phone] . Son was updated at bedside.  Disposition: Was evaluated by PT and SNF was recommended., However patient did not meet inpatient criteria and will need to be private pay for SNF. Social worker stated that she is awaiting awaiting [place] # for patient , she can probably be discharged tomorrow.  LOS: 3 days   Mackenzie Harmon 02/01/2012, 10:09 AM

## 2012-02-01 NOTE — Progress Notes (Signed)
PT has been refusing CPAP. RT will continue to monitor. 

## 2012-02-02 LAB — GLUCOSE, CAPILLARY: Glucose-Capillary: 109 mg/dL — ABNORMAL HIGH (ref 70–99)

## 2012-02-02 MED ORDER — DIPHTH-ACELL PERTUSSIS-TETANUS 25-58-10 LF-MCG/0.5 IM SUSP: 0.5000 mL | Freq: Once | INTRAMUSCULAR | Status: DC

## 2012-02-02 MED ORDER — ATORVASTATIN CALCIUM 20 MG PO TABS: 20.0000 mg | ORAL_TABLET | Freq: Every day | ORAL | Status: AC

## 2012-02-02 MED ORDER — AMLODIPINE BESYLATE 5 MG PO TABS: 5.0000 mg | ORAL_TABLET | Freq: Every day | ORAL | Status: AC

## 2012-02-02 MED ORDER — TETANUS-DIPHTH-ACELL PERTUSSIS 5-2.5-18.5 LF-MCG/0.5 IM SUSP
0.5000 mL | Freq: Once | INTRAMUSCULAR | Status: AC
  Administered 2012-02-02: 0.5 mL via INTRAMUSCULAR
  Filled 2012-02-02 (×2): qty 0.5

## 2012-02-02 MED ORDER — HYDRALAZINE HCL 10 MG PO TABS: 10.0000 mg | ORAL_TABLET | Freq: Three times a day (TID) | ORAL | Status: AC

## 2012-02-02 NOTE — Progress Notes (Signed)
Pts last recorded BM 01/27/12; PRN dose Miralax given; monitor.Carlean Purl

## 2012-02-02 NOTE — Discharge Summary (Signed)
DISCHARGE SUMMARY  Mackenzie Harmon  MR#: [id number]  DOB:1932/10/24  Date of Admission: 01/29/2012 Date of Discharge: 02/02/2012  Attending Physician:Kedrick Mcnamee K  Patient's WUJ:WJXB,JYNWGNFAOZ R, MD, MD  Consults: -none  Discharge Diagnoses: Present on Admission:  .HYPERTENSION .OBSTRUCTIVE SLEEP APNEA .Diabetes mellitus .Dementia .HYPERLIPIDEMIA .DEPRESSION    Medication List  As of 02/02/2012 11:58 AM   STOP taking these medications         simvastatin 40 MG tablet      sitaGLIPtin 100 MG tablet         TAKE these medications         ALPRAZolam 0.5 MG tablet   Commonly known as: XANAX   Take 0.5 mg by mouth daily as needed. For anxiety      amLODipine 5 MG tablet   Commonly known as: NORVASC   Take 1 tablet (5 mg total) by mouth daily.      aspirin 81 MG tablet   Take 81 mg by mouth daily.      atorvastatin 20 MG tablet   Commonly known as: LIPITOR   Take 1 tablet (20 mg total) by mouth daily at 6 PM.      citalopram 20 MG tablet   Commonly known as: CELEXA   Take 20 mg by mouth daily.      docusate sodium 100 MG capsule   Commonly known as: COLACE   Take 100 mg by mouth 2 (two) times a week. As needed for constipation      donepezil 10 MG tablet   Commonly known as: ARICEPT   Take 10 mg by mouth at bedtime.      hydrALAZINE 10 MG tablet   Commonly known as: APRESOLINE   Take 1 tablet (10 mg total) by mouth 3 (three) times daily.      losartan 50 MG tablet   Commonly known as: COZAAR   Take 50 mg by mouth 2 (two) times daily.      metFORMIN 1000 MG tablet   Commonly known as: GLUCOPHAGE   Take 1,000 mg by mouth 2 (two) times daily.      polyethylene glycol powder powder   Commonly known as: GLYCOLAX/MIRALAX   Take 17 g by mouth daily as needed.      Vitamin D (Ergocalciferol) 50000 UNITS Caps   Commonly known as: DRISDOL   Take 50,000 Units by mouth 2 (two) times a week. On Tuesdays and Saturdays             Hospital  Course: Present on Admission:  .HYPERTENSION: Blood pressures remained elevated, likely a combination of previous malignant hypertension with some increased secondary to worsening dementia and increasing agitation. Added by mouth Norvasc plus by mouth hydralazine to her ACE inhibitor.  .OBSTRUCTIVE SLEEP APNEA: Difficult to manage, given the patient's dementia and she refuses CPAP at night.  .Diabetes mellitus: With a controlled diet, patient's blood sugars off of Januvia were stable rarely requiring sliding-scale sensitive insulin. Have recommended discontinuation of her Januvia altogether given risk for hypoglycemia.  .Dementia: Primary issue. Patient came in with increasing confusion and some difficulty walking. Difficulty walking was felt to be secondary to dementia. Patient had a complete stroke work up including echo plus Doppler's plus CT all of which were negative. It was felt that the patient's best bet would be to go to a full term skilled nursing facility instead of returning to her previous assisted living facility. This has been set up and patient will go today. Already on  Aricept  .HYPERLIPIDEMIA: Change her statin medication to Lipitor, given increased risk for rhabdomyolysis with combination Norvasc plus Zocor at 40 mg.  Marland KitchenDEPRESSION: Continue Celexa.   Day of Discharge BP 184/77  Pulse 63  Temp(Src) 98.2 F (36.8 C) (Oral)  Resp 17  Ht 5\' 4"  (1.626 m)  Wt 70 kg (154 lb 5.2 oz)  BMI 26.49 kg/m2  SpO2 98% note, his blood pressure was checked prior to her morning medications.  Physical Exam: Gen.: Alert but oriented x1 only no acute distress. Looks about stated age Cardiovascular: Regular rate and rhythm S1-S2, soft 2/6 systolic ejection murmur Lungs: Clear to auscultation bilaterally Abdomen: Soft, nontender, nondistended, positive bowel sounds Extremities: Clubbing or cyanosis, trace pitting edema  Results for orders placed during the hospital encounter of 01/29/12  (from the past 24 hour(s))  GLUCOSE, CAPILLARY     Status: Abnormal   Collection Time   02/01/12  9:45 PM      Component Value Range   Glucose-Capillary 126 (*) 70 - 99 (mg/dL)   Comment 1 Documented in Chart     Comment 2 Notify RN    GLUCOSE, CAPILLARY     Status: Abnormal   Collection Time   02/02/12  6:41 AM      Component Value Range   Glucose-Capillary 109 (*) 70 - 99 (mg/dL)    Disposition: Improved from initial presentation. Overall to worsening dementia. Being discharged to skilled nursing facility.   Follow-up Appts: Discharge Orders    Future Orders Please Complete By Expires   Diet - low sodium heart healthy      Increase activity slowly         Follow-up Information    Follow up with Hoyle Sauer, MD in 1 month.   Contact information:   [address] [facility] [address], [place]. [facility] [place] [address] [phone]         patient may end up being followed by primary care physician at skilled nursing facility, but will leave this up to the family.  Tests Needing Follow-up:  none  Time spent in discharge (includes decision making & examination of pt): 40 minutes  Signed: Hollice Espy 02/02/2012, 11:58 AM

## 2012-02-02 NOTE — Progress Notes (Signed)
Clinical social worker confirmed with pt daughter and facility that pt plans to dc to [facility] later today.  Pt daughter plans on completing paperwork at facility and pt will transport by non emergency ambulance early this afternoon.   .Clinical social worker continuing to follow pt to assist with pt dc plans and further csw needs.   Catha Gosselin, Theresia Majors  [phone] .02/02/2012 11:11am

## 2012-02-02 NOTE — Progress Notes (Signed)
.  Clinical social worker assisted with patient discharge to skilled nursing facility, [facility]. .Patient transportation provided by [employer] [facility] with patient chart copy. .No further Clinical Social Work needs, signing off.   Catha Gosselin, Theresia Majors  [phone] .02/02/2012 14:37pm

## 2012-02-02 NOTE — Progress Notes (Signed)
Physical Therapy Treatment Patient Details Name: Mackenzie Harmon MRN: [id number] DOB: 03-Sep-1932 Today's Date: 02/02/2012  PT Assessment/Plan Comments on Treatment Session: Pt continues with symptoms of BPPV, however due to limited cervical ROM (rotation and extension) and dementia with agitation, pt is very difficult to treat with cannalith repositioning (even with 2 experienced PTs).  Will defer further attempts at BPPV treatment and continue with mobility training. PT Plan: Discharge plan remains appropriate;Frequency remains appropriate PT Frequency: Min 3X/week Follow Up Recommendations: Skilled nursing facility Equipment Recommended: Defer to next venue PT Goals  Acute Rehab PT Goals PT Goal: Sit to Stand - Progress: Progressing toward goal PT Goal: Stand to Sit - Progress: Progressing toward goal PT Transfer Goal: Bed to Chair/Chair to Bed - Progress: Progressing toward goal  PT Treatment Precautions/Restrictions  Precautions Precautions: Fall Restrictions Weight Bearing Restrictions: No Mobility (including Balance) Bed Mobility Rolling Left: 1: +2 Total assist;Patient percentage (comment) Rolling Left Details (indicate cue type and reason): Pt resisting; rolling during cannalith repositioning therefore requiring incr assist Left Sidelying to Sit: 1: +2 Total assist;Patient percentage (comment);HOB flat Left Sidelying to Sit Details (indicate cue type and reason): Pt resisting; during cannalith repositioning therefore requiring incr assist Sitting - Scoot to Edge of Bed: 5: Supervision Transfers Sit to Stand: 3: Mod assist;From bed;With upper extremity assist Sit to Stand Details (indicate cue type and reason): Pt restless, near agitation after cannalith repositioning and allowed to put bil hands on RW as she preferred; assist due to posterior lean Stand to Sit: 4: Min assist;With upper extremity assist;With armrests;To chair/3-in-1 Stand to Sit Details: able to align with  chair/armrests and initiate sitting with verbal cues Ambulation/Gait Ambulation/Gait: No (pt refusing after BPPV treatment)  High Level Balance High Level Balance Comments: Pt continues to report spinning sensation "at times;" denies present all the time.  Second therapist Mackenzie Harmon, PT) in to assist with cannalith repositioning for the Rt posterior canal due to complexity of the situation (pt with decr neck ROM and easily agitated due to dementia).  Bed placed in trendelenberg and pt assisted into long sitting with quick return to supine for [facility] testing (although pt resisting and movement was not as quick as necessary).  No nystagmus elicited, however completed cannalith repositioning on Rt due to continued complaints of spinning. Exercise    End of Session PT - End of Session Equipment Utilized During Treatment: Other (comment) (deferred gait belt due to agitation; +2 available for safety) Activity Tolerance: Other (comment) (limited by dementia and increasing agitation) Patient left: in chair;with call bell in reach Nurse Communication: Mobility status for transfers;Other (comment) (suspected BPPV and treatment provided) General Behavior During Session: Agitated Cognition: Impaired, at baseline  Mackenzie Harmon 02/02/2012, 10:21 AM Pager [phone]

## 2012-02-08 LAB — GLUCOSE, CAPILLARY
Glucose-Capillary: 99 mg/dL (ref 70–99)
Glucose-Capillary: 83 mg/dL (ref 70–99)

## 2012-04-19 IMAGING — CT CT CHEST W/ CM
2 of 4 series · 15 of 36 positions shown, 18 images · IV contrast (agent unspecified)
Comparison: Plain film of 05/11/2005.

CLINICAL DATA: Right-sided pleural effusion.  Chest pain.
Dizziness.

CHEST CT WITH CONTRAST
TECHNIQUE: Multidetector CT imaging of the chest was performed
following the standard protocol during the  administration of IV
contrast.
Contrast: 100  ml 3mnipaque-VHH

[Series 2: routine chest 5.0 st · axial · 0.74mm/px · z∈[+972,+1242]mm · 12 of 60 slices shown, 15 images]
[im 3/60  mediastinal]
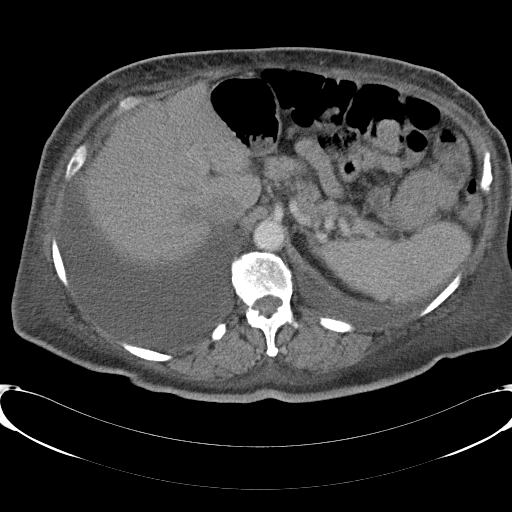
[im 3/60  lung]
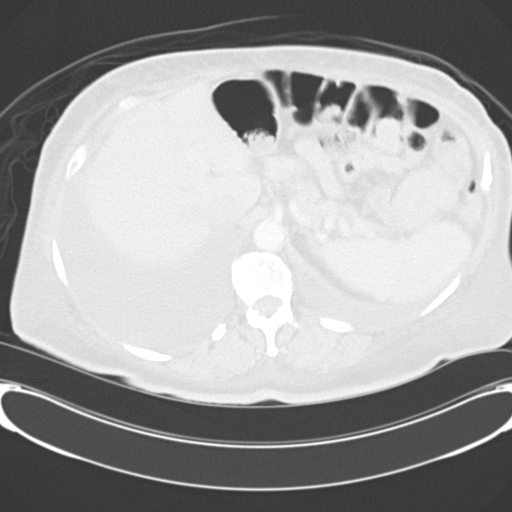
[im 9/60  lung]
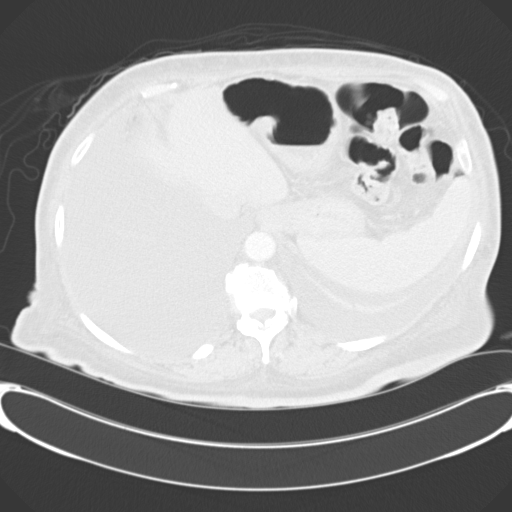
[im 14/60  lung]
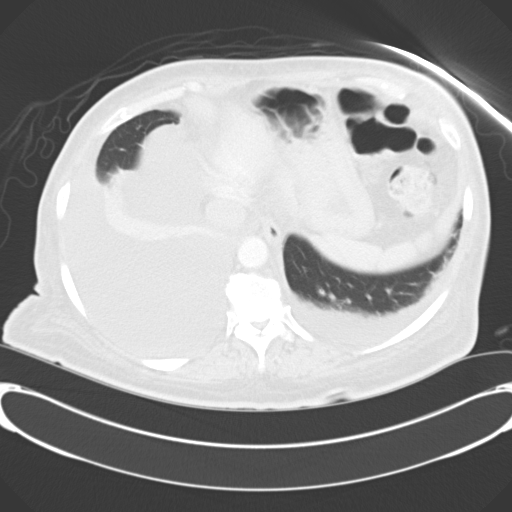
[im 19/60  lung]
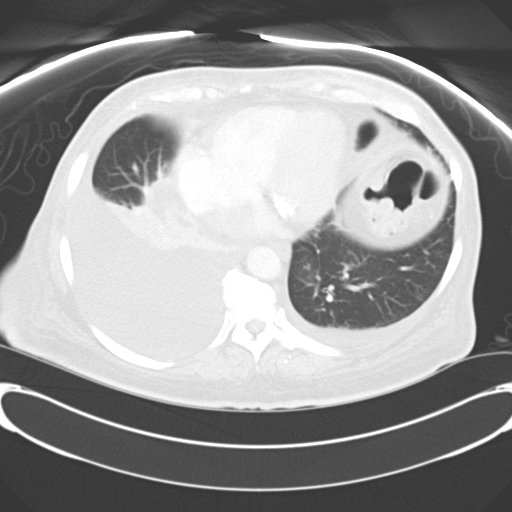
[im 22/60  mediastinal]
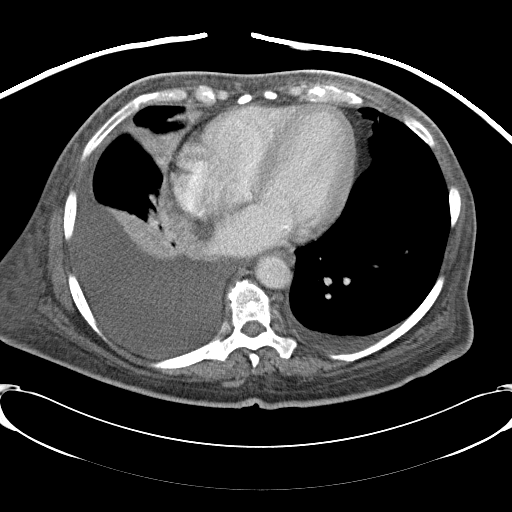
[im 22/60  lung]
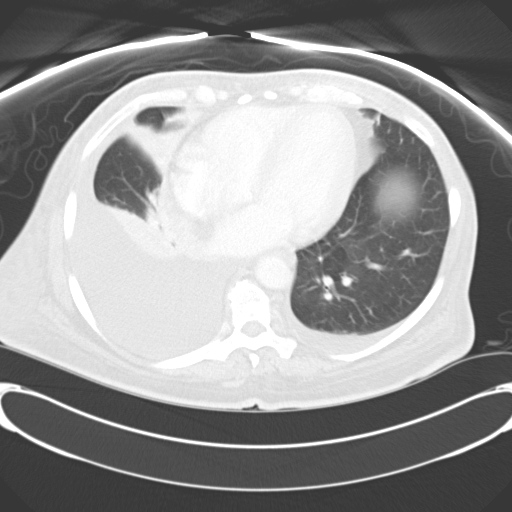
[im 27/60  lung]
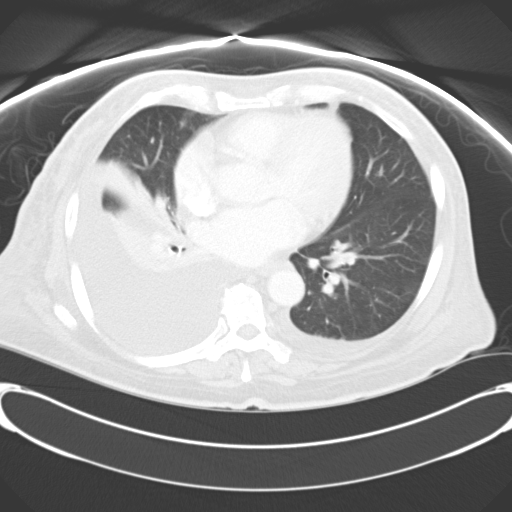
[im 33/60  lung]
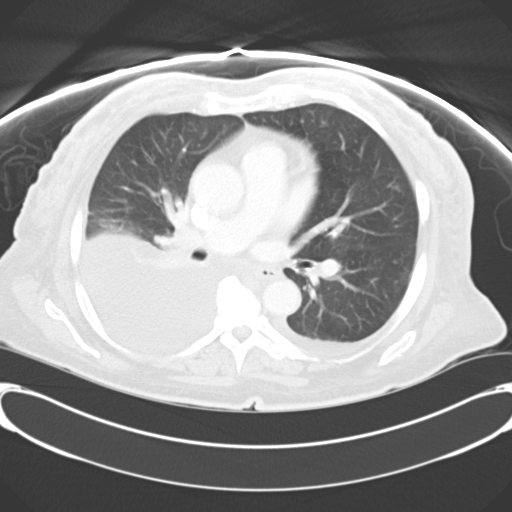
[im 38/60  lung]
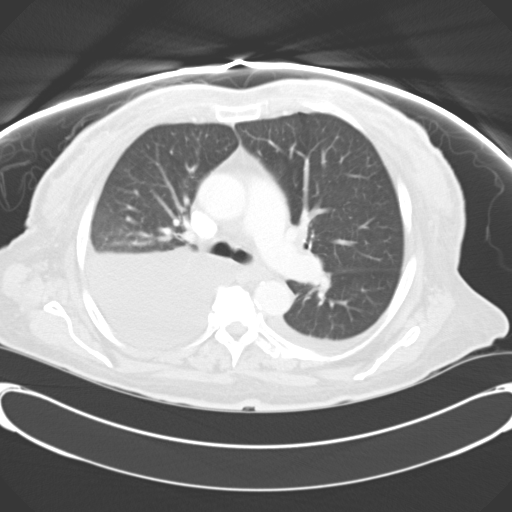
[im 41/60  mediastinal]
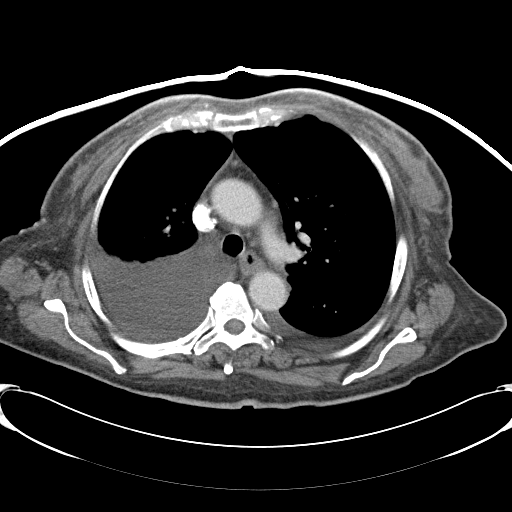
[im 41/60  lung]
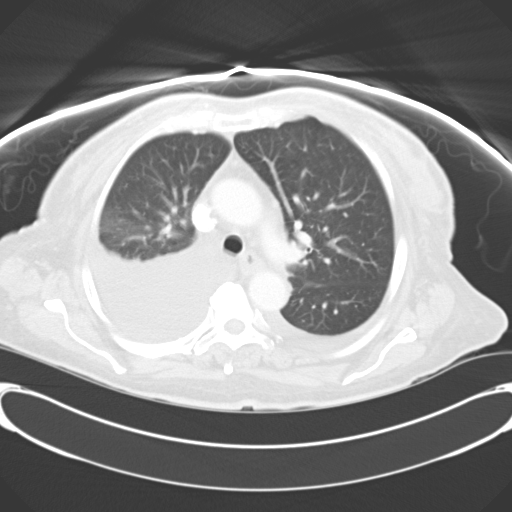
[im 46/60  lung]
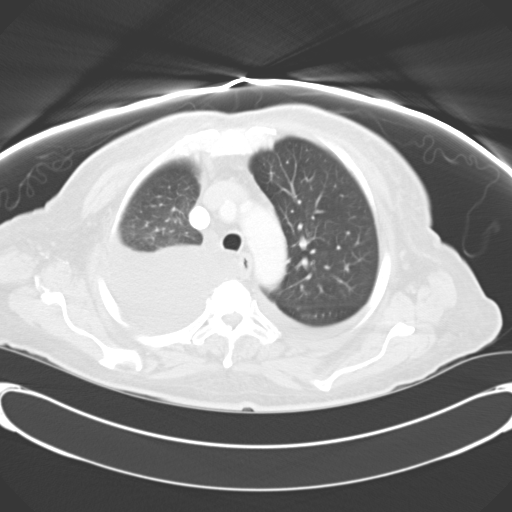
[im 51/60  lung]
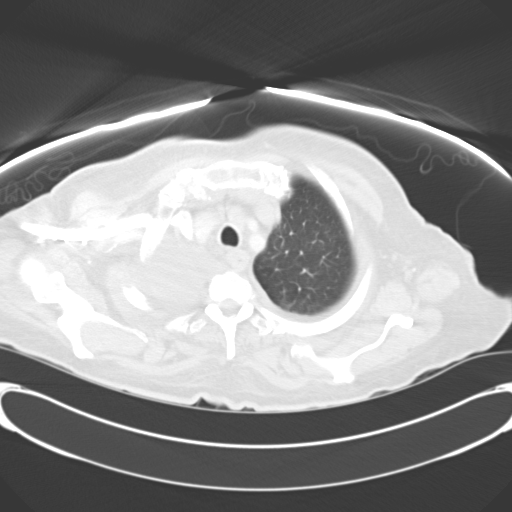
[im 57/60  lung]
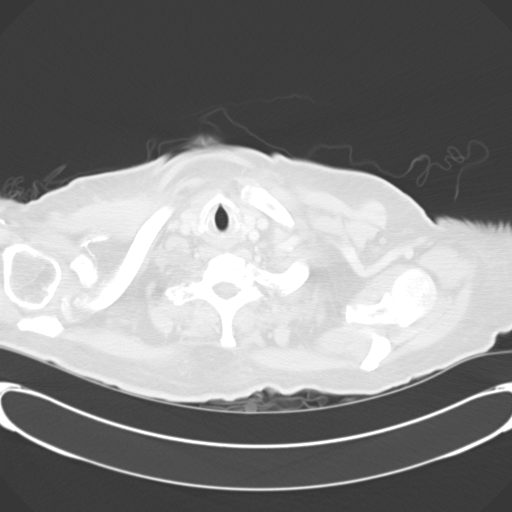

[Series 6: routine chest 2.0 st · coronal · 0.74mm/px · 3 of 141 slices shown]
[im 29/141  lung]
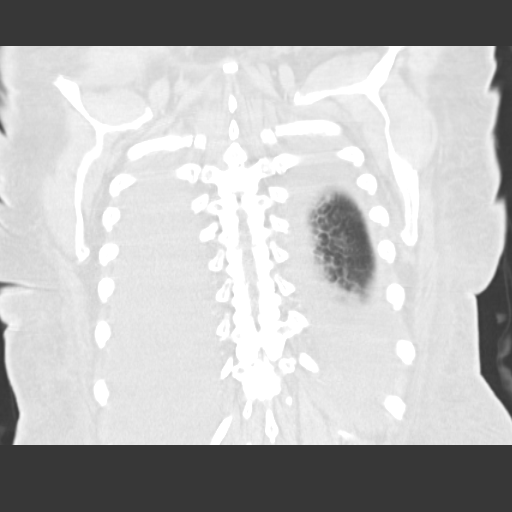
[im 57/141  lung]
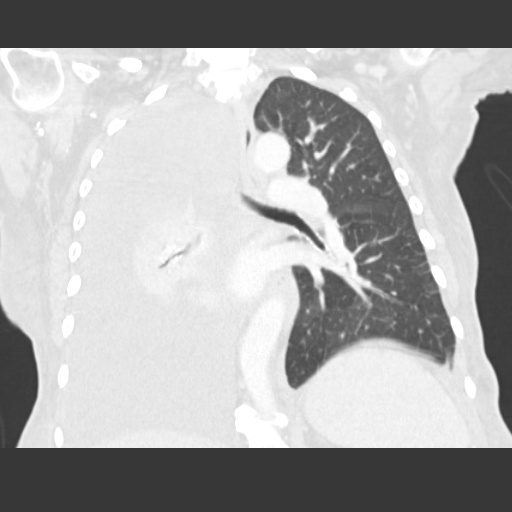
[im 85/141  lung]
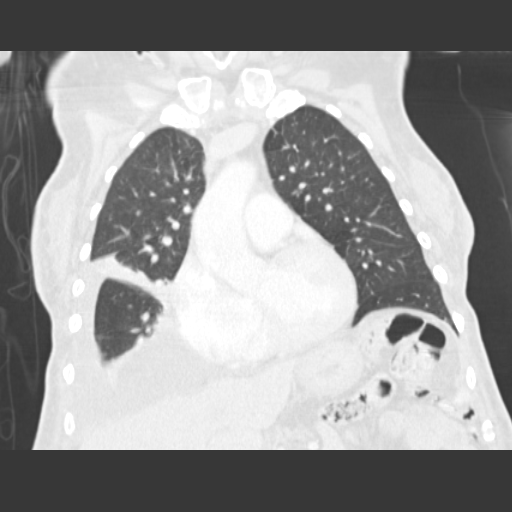

[15 of 36 positions shown; findings below may reference images not displayed]

FINDINGS: Lung windows demonstrate minimal motion artifact,
primarily at the level of the carina.  Patent airways including to
the right lower lobe to the segmental level.

Volume loss within the dependent right upper lobe and right middle
lobe.  No convincing evidence of underlying lung mass.

Clear left lung.

Soft tissue windows demonstrate nonspecific hypoattenuating left
thyroid nodule with calcification.  This measures 1.5 cm on image
9.

Mild cardiomegaly with mitral annular calcifications.  No
pericardial effusion.  A small simple appearing left pleural
effusion.  Moderate right-sided pleural effusion layers
posteriorly.  No evidence of pleural thickening or nodularity.

No mediastinal or hilar adenopathy.

Pulmonary artery enlargement at 3.5 cm at the main pulmonary
artery.

Limited abdominal imaging demonstrates normal adrenal glands.
Upper limits of normal retroperitoneal lymph nodes. No acute
osseous abnormality.
IMPRESSION: 1.  Moderate right and small left pleural effusion without evidence
of empyema or nodularity to suggest neoplasm.  Consider right-sided
thoracentesis.
2.  Dependent right lower and right middle lobe atelectasis without
evidence of pulmonary parenchymal neoplasm or pneumonia.  Recommend
radiographic follow-up until clearing.
3.  Pulmonary artery enlargement suggests pulmonary arterial
hypertension.
4.  Left thyroid nodule is indeterminate.  Given calcifications,
consider further evaluation with thyroid ultrasound.

## 2012-04-21 IMAGING — CR DG CHEST 2V
2 series · 2 of 2 positions shown · non-contrast
Comparison: 10/22/2010

CLINICAL DATA: Right pleural effusion.  Shortness of breath.

CHEST - 2 VIEW

[w chest pa]
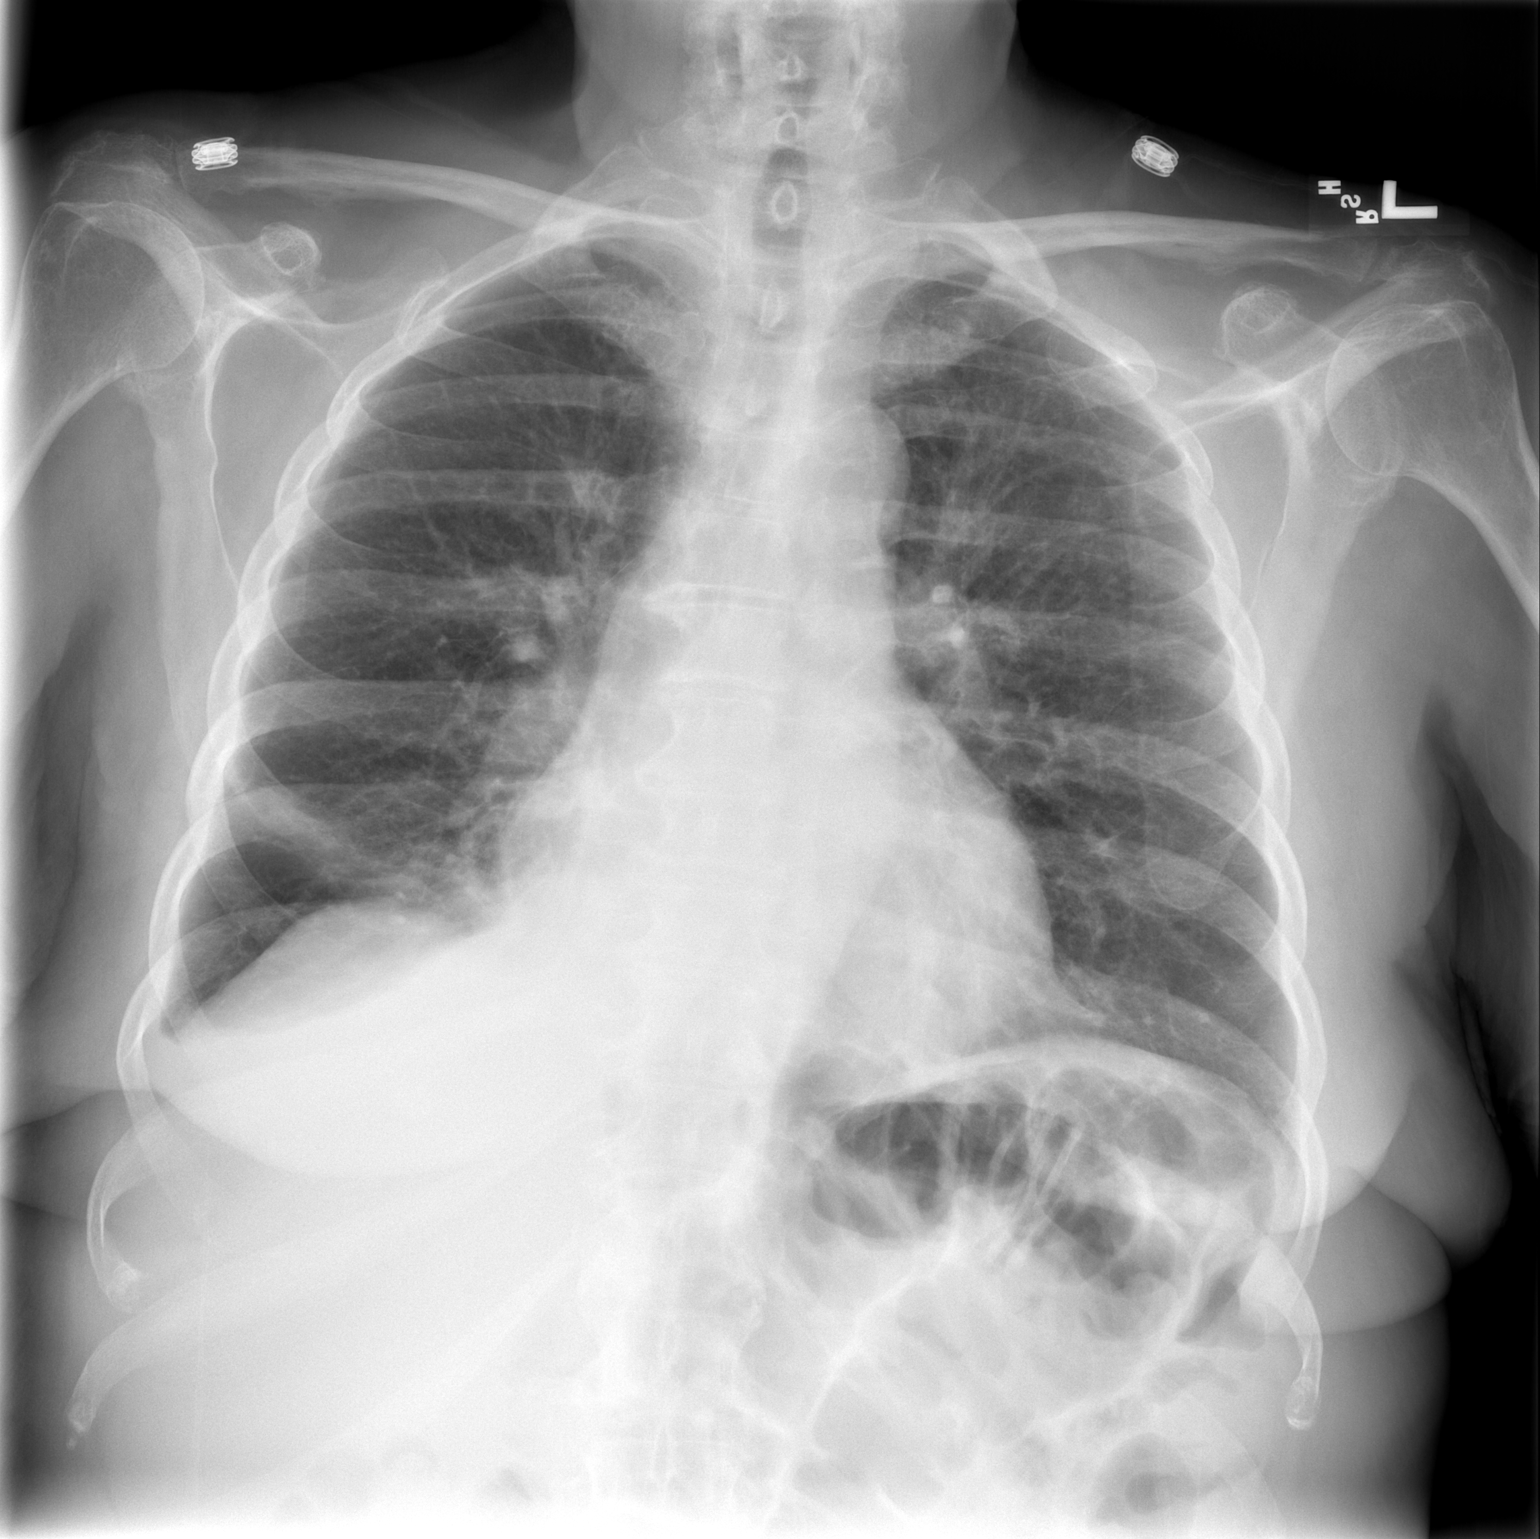

[w chest lat]
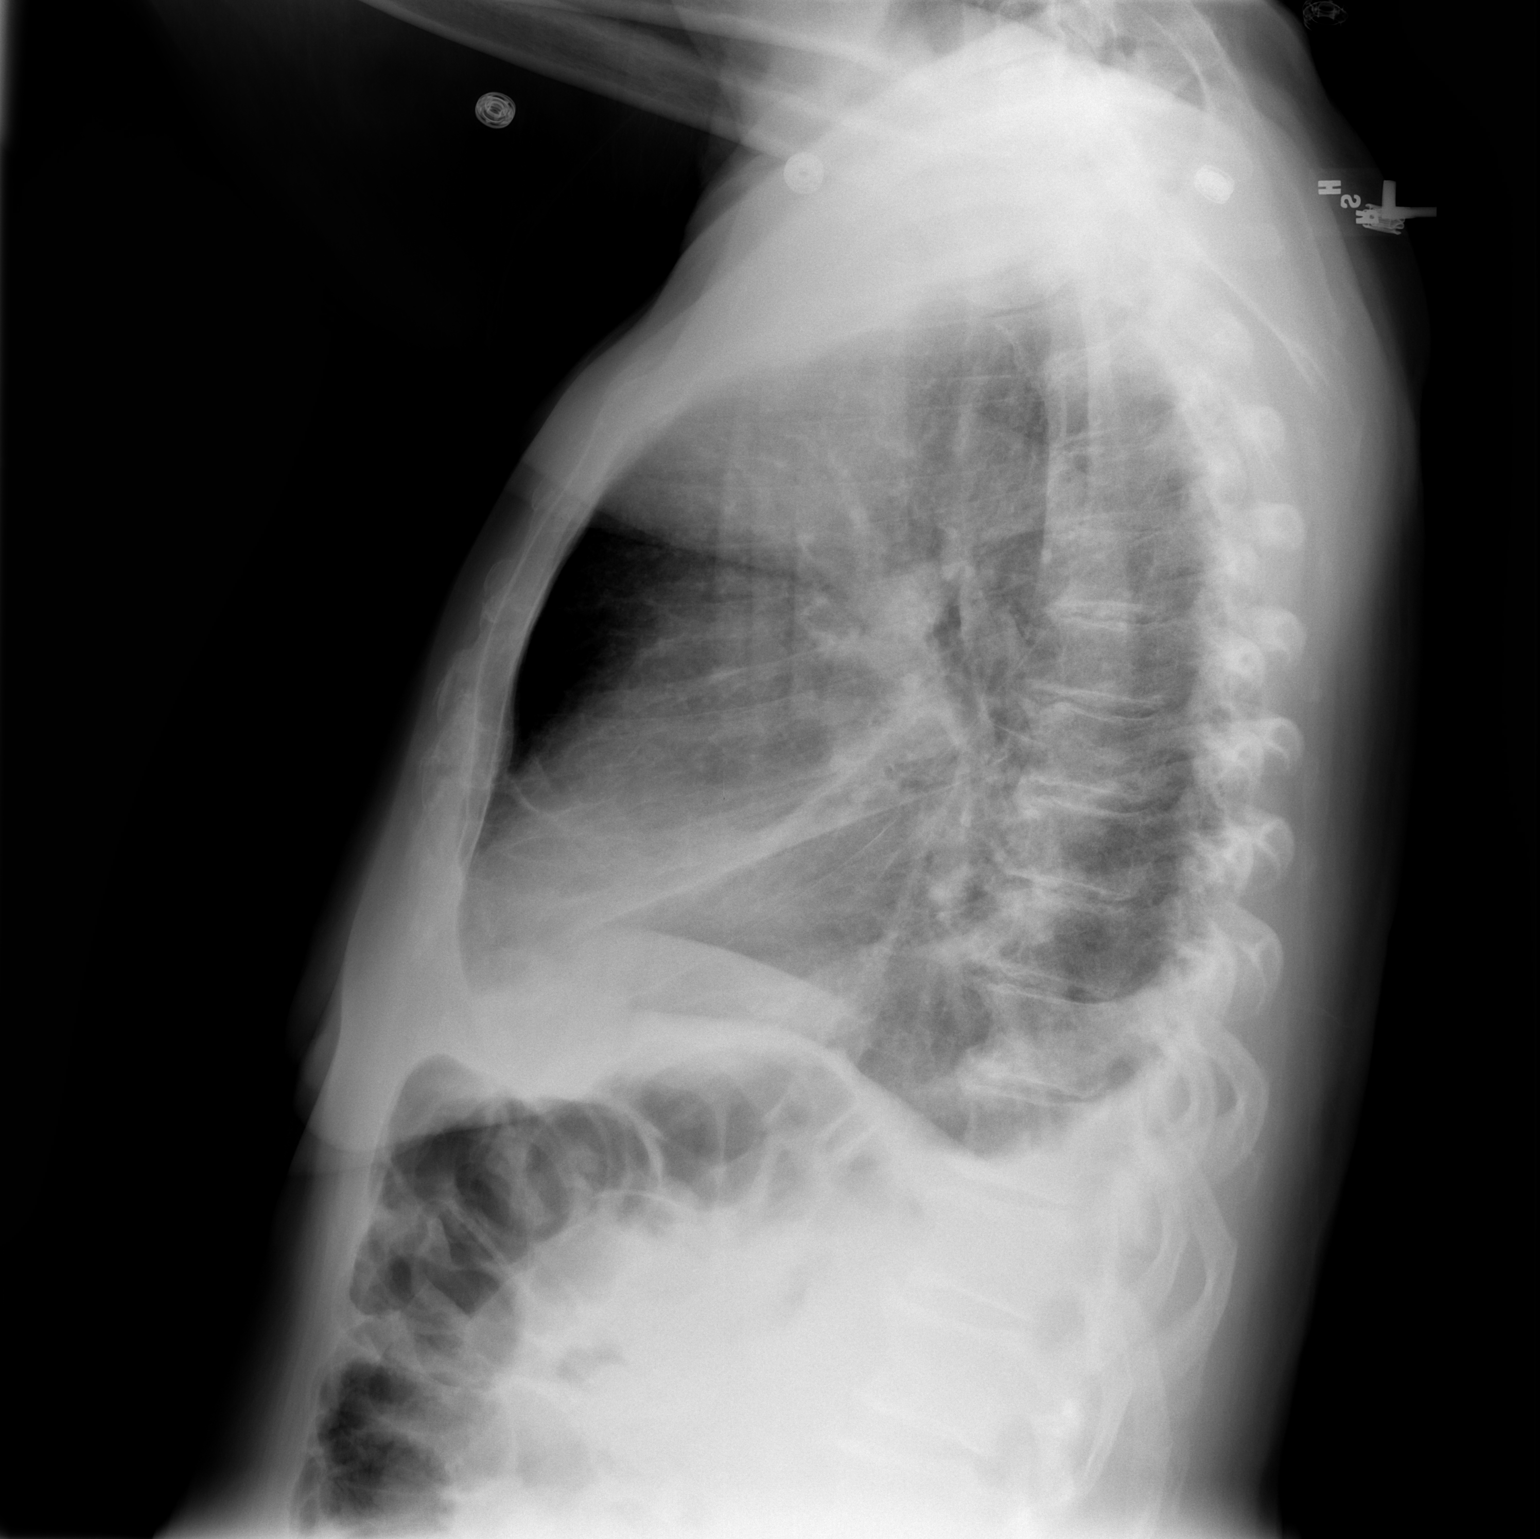

[2 of 2 positions shown; findings below may reference images not displayed]

FINDINGS: Cardiopericardial silhouette is at upper limits of normal
for size. Interstitial markings are diffusely coarsened with
chronic features.  A small right pleural effusion persists with
associated right base atelectasis.  There is probably also a tiny
left pleural effusion. Imaged bony structures of the thorax are
intact.
IMPRESSION: No substantial interval change exam.

## 2013-11-22 DEATH — deceased

## 2013-12-10 ENCOUNTER — Telehealth: Payer: Self-pay

## 2013-12-10 NOTE — Telephone Encounter (Signed)
Patient past away @ [facility] per [facility] in [place] [employer] °
